# Patient Record
Sex: Male | Born: 1945 | Race: White | Hispanic: No | Marital: Married | Smoking: Former smoker
Health system: Southern US, Community
[De-identification: ages and names within clinical notes are randomized; demographics above are authoritative.]

## PROBLEM LIST (undated history)

## (undated) DIAGNOSIS — T7840XA Allergy, unspecified, initial encounter: Secondary | ICD-10-CM

## (undated) DIAGNOSIS — IMO0002 Reserved for concepts with insufficient information to code with codable children: Secondary | ICD-10-CM

## (undated) DIAGNOSIS — K449 Diaphragmatic hernia without obstruction or gangrene: Secondary | ICD-10-CM

## (undated) DIAGNOSIS — Z9889 Other specified postprocedural states: Secondary | ICD-10-CM

## (undated) DIAGNOSIS — M5412 Radiculopathy, cervical region: Secondary | ICD-10-CM

## (undated) DIAGNOSIS — M1990 Unspecified osteoarthritis, unspecified site: Secondary | ICD-10-CM

## (undated) DIAGNOSIS — I471 Supraventricular tachycardia, unspecified: Secondary | ICD-10-CM

## (undated) DIAGNOSIS — R569 Unspecified convulsions: Secondary | ICD-10-CM

## (undated) DIAGNOSIS — Z5189 Encounter for other specified aftercare: Secondary | ICD-10-CM

## (undated) DIAGNOSIS — I2510 Atherosclerotic heart disease of native coronary artery without angina pectoris: Secondary | ICD-10-CM

## (undated) DIAGNOSIS — M5416 Radiculopathy, lumbar region: Secondary | ICD-10-CM

## (undated) DIAGNOSIS — K219 Gastro-esophageal reflux disease without esophagitis: Secondary | ICD-10-CM

## (undated) DIAGNOSIS — F419 Anxiety disorder, unspecified: Secondary | ICD-10-CM

## (undated) DIAGNOSIS — R413 Other amnesia: Secondary | ICD-10-CM

## (undated) DIAGNOSIS — M542 Cervicalgia: Secondary | ICD-10-CM

## (undated) DIAGNOSIS — C801 Malignant (primary) neoplasm, unspecified: Secondary | ICD-10-CM

## (undated) DIAGNOSIS — K2270 Barrett's esophagus without dysplasia: Secondary | ICD-10-CM

## (undated) DIAGNOSIS — I77810 Thoracic aortic ectasia: Secondary | ICD-10-CM

## (undated) DIAGNOSIS — R55 Syncope and collapse: Secondary | ICD-10-CM

## (undated) DIAGNOSIS — R001 Bradycardia, unspecified: Secondary | ICD-10-CM

## (undated) DIAGNOSIS — R9430 Abnormal result of cardiovascular function study, unspecified: Secondary | ICD-10-CM

## (undated) DIAGNOSIS — J189 Pneumonia, unspecified organism: Secondary | ICD-10-CM

## (undated) DIAGNOSIS — IMO0001 Reserved for inherently not codable concepts without codable children: Secondary | ICD-10-CM

## (undated) DIAGNOSIS — M549 Dorsalgia, unspecified: Secondary | ICD-10-CM

## (undated) DIAGNOSIS — G8929 Other chronic pain: Secondary | ICD-10-CM

## (undated) DIAGNOSIS — F0390 Unspecified dementia without behavioral disturbance: Secondary | ICD-10-CM

## (undated) DIAGNOSIS — G4730 Sleep apnea, unspecified: Secondary | ICD-10-CM

## (undated) DIAGNOSIS — E7800 Pure hypercholesterolemia, unspecified: Secondary | ICD-10-CM

## (undated) HISTORY — DX: Abnormal result of cardiovascular function study, unspecified: R94.30

## (undated) HISTORY — DX: Syncope and collapse: R55

## (undated) HISTORY — DX: Allergy, unspecified, initial encounter: T78.40XA

## (undated) HISTORY — DX: Reserved for concepts with insufficient information to code with codable children: IMO0002

## (undated) HISTORY — PX: HAND SURGERY: SHX662

## (undated) HISTORY — DX: Atherosclerotic heart disease of native coronary artery without angina pectoris: I25.10

## (undated) HISTORY — PX: NECK SURGERY: SHX720

## (undated) HISTORY — PX: ANAL FISSURE REPAIR: SHX2312

## (undated) HISTORY — PX: OTHER SURGICAL HISTORY: SHX169

## (undated) HISTORY — DX: Unspecified convulsions: R56.9

## (undated) HISTORY — DX: Other amnesia: R41.3

## (undated) HISTORY — DX: Supraventricular tachycardia, unspecified: I47.10

## (undated) HISTORY — DX: Bradycardia, unspecified: R00.1

## (undated) HISTORY — DX: Other specified postprocedural states: Z98.890

## (undated) HISTORY — DX: Pure hypercholesterolemia, unspecified: E78.00

## (undated) HISTORY — DX: Supraventricular tachycardia: I47.1

## (undated) HISTORY — PX: VASECTOMY: SHX75

## (undated) HISTORY — DX: Barrett's esophagus without dysplasia: K22.70

## (undated) HISTORY — PX: TONSILLECTOMY: SUR1361

## (undated) HISTORY — DX: Thoracic aortic ectasia: I77.810

## (undated) HISTORY — PX: EXTERNAL EAR SURGERY: SHX627

---

## 2000-05-08 ENCOUNTER — Inpatient Hospital Stay (HOSPITAL_COMMUNITY): Admission: EM | Admit: 2000-05-08 | Discharge: 2000-05-10 | Payer: Self-pay | Admitting: Emergency Medicine

## 2000-05-08 ENCOUNTER — Encounter: Payer: Self-pay | Admitting: Cardiology

## 2001-05-07 ENCOUNTER — Ambulatory Visit (HOSPITAL_COMMUNITY): Admission: RE | Admit: 2001-05-07 | Discharge: 2001-05-07 | Payer: Self-pay | Admitting: General Surgery

## 2001-10-18 ENCOUNTER — Ambulatory Visit (HOSPITAL_COMMUNITY): Admission: RE | Admit: 2001-10-18 | Discharge: 2001-10-18 | Payer: Self-pay

## 2003-06-16 ENCOUNTER — Ambulatory Visit (HOSPITAL_COMMUNITY): Admission: RE | Admit: 2003-06-16 | Discharge: 2003-06-16 | Payer: Self-pay | Admitting: General Surgery

## 2003-06-24 ENCOUNTER — Ambulatory Visit (HOSPITAL_COMMUNITY): Admission: RE | Admit: 2003-06-24 | Discharge: 2003-06-24 | Payer: Self-pay | Admitting: Family Medicine

## 2006-02-02 ENCOUNTER — Ambulatory Visit: Payer: Self-pay | Admitting: Cardiology

## 2006-02-02 ENCOUNTER — Inpatient Hospital Stay (HOSPITAL_COMMUNITY): Admission: AD | Admit: 2006-02-02 | Discharge: 2006-02-04 | Payer: Self-pay | Admitting: Cardiology

## 2006-03-14 ENCOUNTER — Ambulatory Visit: Payer: Self-pay | Admitting: Gastroenterology

## 2006-03-29 ENCOUNTER — Encounter (INDEPENDENT_AMBULATORY_CARE_PROVIDER_SITE_OTHER): Payer: Self-pay | Admitting: Specialist

## 2006-03-29 ENCOUNTER — Ambulatory Visit (HOSPITAL_COMMUNITY): Admission: RE | Admit: 2006-03-29 | Discharge: 2006-03-29 | Payer: Self-pay | Admitting: Gastroenterology

## 2006-03-29 ENCOUNTER — Ambulatory Visit: Payer: Self-pay | Admitting: Gastroenterology

## 2006-03-29 DIAGNOSIS — Z9889 Other specified postprocedural states: Secondary | ICD-10-CM

## 2006-03-29 HISTORY — DX: Other specified postprocedural states: Z98.890

## 2006-04-11 ENCOUNTER — Ambulatory Visit: Payer: Self-pay | Admitting: Gastroenterology

## 2006-09-29 ENCOUNTER — Other Ambulatory Visit: Admission: RE | Admit: 2006-09-29 | Discharge: 2006-09-29 | Payer: Self-pay | Admitting: Orthopaedic Surgery

## 2006-09-29 ENCOUNTER — Encounter (INDEPENDENT_AMBULATORY_CARE_PROVIDER_SITE_OTHER): Payer: Self-pay | Admitting: *Deleted

## 2007-04-24 ENCOUNTER — Ambulatory Visit (HOSPITAL_COMMUNITY): Admission: RE | Admit: 2007-04-24 | Discharge: 2007-04-24 | Payer: Self-pay | Admitting: Family Medicine

## 2007-05-02 ENCOUNTER — Emergency Department (HOSPITAL_COMMUNITY): Admission: EM | Admit: 2007-05-02 | Discharge: 2007-05-02 | Payer: Self-pay | Admitting: Emergency Medicine

## 2007-07-17 ENCOUNTER — Inpatient Hospital Stay (HOSPITAL_COMMUNITY): Admission: RE | Admit: 2007-07-17 | Discharge: 2007-07-19 | Payer: Self-pay | Admitting: Neurosurgery

## 2008-01-08 ENCOUNTER — Encounter: Admission: RE | Admit: 2008-01-08 | Discharge: 2008-01-08 | Payer: Self-pay | Admitting: Neurosurgery

## 2008-11-04 ENCOUNTER — Encounter: Admission: RE | Admit: 2008-11-04 | Discharge: 2008-11-04 | Payer: Self-pay | Admitting: Neurology

## 2009-05-19 DIAGNOSIS — E119 Type 2 diabetes mellitus without complications: Secondary | ICD-10-CM

## 2009-05-19 DIAGNOSIS — E785 Hyperlipidemia, unspecified: Secondary | ICD-10-CM | POA: Insufficient documentation

## 2011-01-11 NOTE — Op Note (Signed)
NAME:  Joshua Khan, Joshua Khan NO.:  [id number]   MEDICAL RECORD NO.:  [id number]          PATIENT TYPE:  INP   LOCATION:  5023                         FACILITY:  [facility]   PHYSICIAN:  Hilda Lias, M.D.   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   PROCEDURE:  C5-6, C6-7 diskectomy, decompression of the spinal cord,  bilateral foraminotomy, interbody fusion with allograft plate from C5 to  C7, microscope.   SURGEON:  Hilda Lias, M.D.   ASSISTANT:  Danae Orleans. Venetia Maxon, M.D.   CLINICAL HISTORY:  The patient was admitted because of neck pain with  radiation into both upper extremities.  X-rays showed severe stenosis at  the level C5-6, C6-7. The patient has failed conservative treatment.  Surgery was advised.   DESCRIPTION OF PROCEDURE:  The patient was taken to the OR and after  intubation, the left side of the neck was cleaned with DuraPrep. A  transverse incision was made through the skin and platysma down to the  cervical spine.  The patient has a big shoulder and we put a needle at  the level of C4-5 because we were unable to localize C5-6, C6-7. Indeed  the x-rays showed that we were at the level of C4-5. Then with the  anterior osteophyte at the level of C5-6 and C6-7 until we found the  calcified anterior ligament. With the microscope, the incision was made  transverse in the ligament.  __________ space between the vertebral  body.  With the drill and the Kerrison punch 1-2 mm, we were able to get  into the posterior ligament. The posterior ligament was opened and  decompression of the spinal cord as well as the foramen was achieved.  The same procedure with the same finding was at the level of C6-7.  From  then on,  the endplates were drilled and two pieces of allograft of 7 mm  were inserted followed by a plate using 6 screws.  The lateral cervical  spine showed at least the upper part of the plate was normal.  From then  on, we waited 5 minutes just to be sure that we have good hemostasis.  Nevertheless, a drain was left and the wound was closed with Vicryl and  Steri-Strips.           ______________________________  Hilda Lias, M.D.     EB/MEDQ  D:  07/17/2007  T:  07/18/2007  Job:  [id number]

## 2011-01-14 NOTE — Cardiovascular Report (Signed)
[place]  Patient:    Joshua Khan, Joshua Khan                          MRN: [id number] Proc. Date: 05/09/00 Adm. Date:  [id number] Attending:  Ophelia Shoulder CC:         Dr. _____________  Lenise Herald, M.D.  Lennette Bihari, M.D., Attn. Morton Stall (Office)   Cardiac Catheterization  NAME OF PROCEDURE:            Cardiac catheterization.  CARDIOLOGIST:                 Lennette Bihari, M.D., F.A.C.C.  INDICATIONS:                  Joshua Khan is a 65 year old gentleman who was admitted yesterday with new onset chest discomfort suggesting possible unstable angina.  He is now referred for cardiac catheterization.  HEMODYNAMIC DATA:             Central aortic pressure is 124/77.  Left ventricular pressure is 124/21.  ANGIOGRAPHIC DATA: 1.  Left main coronary artery is a short and angiographically normal vessel that bifurcates into the an LAD and left circumflex system. 2.  The LAD is a moderate-sized vessel that extends to the apex.  The mid LAD seemed to dip intramyocardially and there was evidence for mild muscle bridging in the distal aspect of this intramyocardial segment up to 30% to less than 40%.  The vessel was smooth without apparent atherosclerotic obstruction. 3.  The left circumflex coronary artery gave rise to a high marginal vessel that almost had a ramus intermedius distribution.   The circumflex and marginal vessel was angiographically normal. 4.  The right coronary artery gave rise to a PDA, a small inferior LV branch and ended in the posterolateral coronary artery.  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 5.  Biplane scintiventriculography showed normal global LV function.  The RAO projection suggested subtle area of mid anterolateral hypocontractility. The LAO projection showed fairly normal  contractility. 6.  Distal aortography did not demonstrate any significant idioiliac disease.  IMPRESSION: 1. Preserved global left ventricular function with a subtle midanterolateral    hypocontractility. 2. No significant coronary obstructive disease, but evidence for    intramyocardial mid left anterior descending segment with mild muscle    bridging with narrowing up to approximately 30% to less than 40%. 3. Normal left circumflex coronary system. 4. 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  DISCUSSION:                   Mr. Advani presented with chest pain syndrome possibly suggesting unstable angina.  Ventriculography does raise the possibility of subtle midanterolateral hypocontractility.  With the demonstration of a mid LAD segment that dips intramyocardially with mild muscle bridging this does raise the possibility of some superimposed spasm in the etiology to this mild wall motion abnormality.  The patient will be started on calcium channel blocker therapy. DD:  05/09/00 TD:  05/10/00 Job: [id number]

## 2011-01-14 NOTE — Cardiovascular Report (Signed)
NAME:  Joshua Khan, Joshua Khan NO.:  [id number]   MEDICAL RECORD NO.:  [id number]          PATIENT TYPE:  INP   LOCATION:  3735                         FACILITY:  [facility]   PHYSICIAN:  Rollene Rotunda, M.D.   DATE OF BIRTH:  1946-02-26   DATE OF PROCEDURE:  02/03/2006  DATE OF DISCHARGE:                              CARDIAC CATHETERIZATION   PRIMARY CARE PHYSICIAN:  Patrica Duel, M.D.   CARDIOLOGIST:  Willa Rough, M.D.   PROCEDURE:  Left heart catheterization/coronary arteriography.   INDICATION:  Evaluate the patient with chest pain and a slightly elevated  troponin. He had previous nonobstructive coronary artery disease on cardiac  catheterization.   PROCEDURE NOTE:  Left heart catheterization is performed via the right  femoral artery. The artery was cannulated using the anterior wall puncture.  A #6-French arterial sheath was inserted via the modified Seldinger  technique. A preformed Judkins and a pigtail catheter were utilized. The  patient tolerated the procedure well and left the laboratory in stable  condition.   RESULTS:   HEMODYNAMICS:  LV 128/21, AO 130/94. Coronaries--the left main was normal.  The LAD wrapped the apex. There was diffuse luminal irregularities. The  first diagonal was large and normal. The second and third diagonals were  small and normal. The circumflex and the AV groove had luminal  irregularities. There was a large ramus intermedius, which was normal. The  right coronary artery was dominant. It had diffuse 25% plaque. There was  somewhat slow flow through the vessel. There was 30% stenosis after the PDA.  There were no high-grade obstructive lesions. The PDA and posterolateral  times two were moderate to small with luminal irregularities.   LEFT VENTRICULOGRAM:  The left ventriculogram was obtained in the RAO  projection. The EF was 60% with normal wall motion.   CONCLUSION:  Nonobstructive plaque. Well-preserved ejection  fraction.  However, there is diffuse cholesterol plaque particularly in the right  system.   PLAN:  The patient will have medical management. It is not clear that his  chest pain is angina. However, it could be related to small vessel disease,  and therefore I will start Imdur.  Refer him back to Dr. Nobie Putnam  (Dr.  Nobie Putnam is  not in the office to discuss this today). Will consider combination therapy  with either Niaspan, Tricor, and a Statin for his dyslipidemia and his  nonobstructive disease. This was discussed with the patient. The patient  will also be referred back to see Dr. Myrtis Ser in [place].           ______________________________  Rollene Rotunda, M.D.     JH/MEDQ  D:  02/03/2006  T:  02/03/2006  Job:  [id number]   cc:   Patrica Duel, M.D.  Fax: [phone]
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
## 2011-01-14 NOTE — Discharge Summary (Signed)
NAME:  GEORGIE, HAQUE NO.:  [id number]   MEDICAL RECORD NO.:  [id number]          PATIENT TYPE:  INP   LOCATION:  3735                         FACILITY:  [facility]   PHYSICIAN:  Willa Rough, M.D.     DATE OF BIRTH:  November 13, 1945   DATE OF ADMISSION:  02/02/2006  DATE OF DISCHARGE:  02/04/2006                                 DISCHARGE SUMMARY   PRIMARY CARDIOLOGIST:  Willa Rough, M.D.   PRIMARY CARE PHYSICIAN:  Patrica Duel, M.D.   REASON FOR ADMISSION:  Transfer from [facility] secondary to chest  pain and equivocal enzymes.   DISCHARGE DIAGNOSES:  1.  Chest pain, question related to small vessel disease.  2.  Non-obstructive coronary disease by catheterization this admission.  3.  Good left ventricular function.  4.  Diet controlled diabetes mellitus.  5.  Asymptomatic sinus bradycardia.  6.  Treated dyslipidemia.  7.  Chronic low back pain.  8.  History of cervical disk disease.  9.  Gastroesophageal reflux disease.      1.  History of esophageal dilatation.  10. The patient has a history of right rib surgery done in the past.   PROCEDURE:  1.  Cardiac catheterization by Dr. Rollene Rotunda. Please see his dictated      note for complete details. Briefly, left main was normal. LAD with      diffuse luminal irregularities. Circumflex with luminal irregularities.      Dominant RCA with diffuse 25% plaque, slow flow 30% after PDA. No high      grade obstructive lesion. EF of 60% with normal wall motion.   HISTORY OF PRESENT ILLNESS:  Please see the consultation note from Dr. Myrtis Ser  for complete details. Briefly, this 65 year old male was admitted at  [facility] with chest discomfort, with radiation to his jaw. His  troponin's were slightly elevated at 0.1. He continued to have pain and  therefore, it was decided to transfer him to [facility] for further  evaluation and cardiac catheterization.   HOSPITAL COURSE:  The patient went for  cardiac catheterization on February 03, 2006. The results are noted above. Dr. Antoine Poche added Imdur 30 mg a day for  possible chest pain related to small vessel disease. Post catheterization,  he had some visual changes. A head CT was negative per report. The patient  was seen by Dr. Myrtis Ser on the morning of February 04, 2006. He felt the patient was  stable enough for discharge to home. In talking to the patient, it was  decided that the patient would followup with Dr. Nobie Putnam. The patient and  Dr. Nobie Putnam would then decide regarding any cardiology followup in the  future.   LABORATORY/DIAGNOSTIC DATA:  Chest x-ray from February 02, 2006 - Tortuous aorta,  top normal heart size, and central pulmonary vasculature. For delineation of  the anterior aspect of the right first rib. A destructive lesion could not  be excluded. This will require followup by apical pleural thickening.  Followup as noted.   White count 6,500. Hemoglobin and hematocrit 13.9 and 40.6.  Platelet count  218,000. INR 1.1, sodium 137, potassium 4.0. Glucose 87, BUN 10, creatinine  0.8. CK MB is negative x2. Troponin I 0.09 and 0.08. Total cholesterol 165,  triglycerides 151, HDL 23, LDL 112.   DISCHARGE MEDICATIONS:  1.  Imdur 30 mg daily.  2.  Coated aspirin 81 mg daily.  3.  Nexium 40 mg daily.  4.  Zetia 10 mg daily.  5.  Nitroglycerin p.r.n. chest pain.   ACTIVITY:  No driving or heavy lifting or sexual activity for 3 days. He may  shower and bathe. He may increase his activity slowly. He is provided with a  supplemental form to go over his discharge instructions.   DIET:  Low-fat, low-sodium, diabetic diet.   WOUND CARE:  He should call our office for any groin bleeding or bruising.   FOLLOWUP:  As noted above, will be with Dr. Nobie Putnam in 1 to 2 weeks and he  should call for an appointment. If the patient and Dr. Nobie Putnam decide for  cardiology followup in the future, then the patient may be seen either with  Dr. Myrtis Ser  in our [place] office or established as a new patient with one of our  doctor's in our [place] office.   DISPOSITION:  The patient has low HDL and high LDL. Consideration could be  made for combination therapy. This will be per his primary care physician.  The patient's chest x-ray is abnormal but he has a history of right rib  surgery. Followup will be per his primary care physician, Dr. Nobie Putnam.   DURATION OF DISCHARGE ENCOUNTER:  Greater than 30 minutes.      Tereso Newcomer, P.A.    ______________________________  Willa Rough, M.D.    SW/MEDQ  D:  02/04/2006  T:  02/04/2006  Job:  [id number]   cc:   Patrica Duel, M.D.  Fax: [phone]   Willa Rough, M.D.  [address]  [address]  [place]  [place] [address]   [facility]  [address]  [address]  [place], [place]. [address]   [facility]  [facility]  [place], [place].

## 2011-01-14 NOTE — Op Note (Signed)
NAME:  Joshua Khan, Joshua Khan NO.:  [id number]   MEDICAL RECORD NO.:  [id number]          PATIENT TYPE:  AMB   LOCATION:  DAY                           FACILITY:  [facility]   PHYSICIAN:  Kassie Mends, M.D.      DATE OF BIRTH:  [birth year]   DATE OF PROCEDURE:  03/30/2006  DATE OF DISCHARGE:                                 OPERATIVE REPORT   PROCEDURE:  Esophagogastroduodenoscopy with cold-forceps biopsy.   MEDICATIONS:  1. Demerol 75 mg IV.  2. Versed 6 mg IV.   INDICATIONS FOR EXAMINATION:  Joshua Khan is a 65 year old male with chest  pressure and upper abdominal pressure.   FINDINGS:  1. Salmon-colored tongue extending from the EG junction.  Biopsies      obtained to assess for short segment Barrett's.  Otherwise esophagus      had no evidence of inflammation or mass.  2. Gastric nodule seen in the antrum.  Biopsies obtained via cold forceps.  3. Normal pylorus and duodenum.   RECOMMENDATIONS:  1. Follow up biopsies.  2. Joshua Khan has received symptomatic improvement on PPI.  The most likely      cause for his symptoms is reflux esophagitis but will await biopsies      for final conclusion.  3. Follow with Dr. Nobie Putnam.  He has a follow-up appointment to see me.   PROCEDURE TECHNIQUE:  Physical exam was performed, and informed consent was  obtained from the patient after explaining all risks, benefits and  alternatives to the procedure which the patient appeared to understand and  so stated.  The patient was connected to monitor device and placed in the  left lateral position.  Continuous oxygen was provided by nasal cannula and  IV medicine administered through an indwelling cannula.  After  administration of sedation, the patient's esophagus was intubated, and the  scope was advanced under direct visualization to the second  portion of the duodenum.  The scope was subsequently removed slowly by  carefully examining the color, texture, anatomy and integrity of the  mucosa  on the way out.  The patient was recovered in endoscopy suite and discharged  to home in satisfactory condition.      Kassie Mends, M.D.  Electronically Signed     SM/MEDQ  D:  03/30/2006  T:  03/30/2006  Job:  [id number]   cc:   Patrica Duel, M.D.  Fax: [phone]

## 2011-01-14 NOTE — Op Note (Signed)
   NAME:  Joshua Khan, Joshua Khan NO.:  [id number]   MEDICAL RECORD NO.:  [id number]                   PATIENT TYPE:  AMB   LOCATION:  DAY                                  FACILITY:  [facility]   PHYSICIAN:  Dalia Heading, M.D.               DATE OF BIRTH:  04-24-1946   DATE OF PROCEDURE:  06/16/2003  DATE OF DISCHARGE:                                 OPERATIVE REPORT   PREOPERATIVE DIAGNOSS:  Intractable anal fissure.   POSTOPERATIVE DIAGNOSS:  Intractable anal fissure.   PROCEDURE:  Anal fissurectomy.   SURGEON:  Dalia Heading, M.D.   ANESTHESIA:  General endotracheal.   INDICATIONS:  The patient is a 65 year old white male who presents with an  intractable anal fissure.  The risks and benefits of the procedure including  bleeding, infection, and recurrence of the fissure were fully explained to  the patient, who gave informed consent   DESCRIPTION OF PROCEDURE:  The patient was placed in the lithotomy position  after induction of general endotracheal anesthesia.  The perineum was  prepped and draped using the usual sterile technique with Betadine.  Surgical site confirmation was performed.   On anoscopy, the patient had minimal hemorrhoidal disease.  No internal  sphincter stricture or spasm were noted.  A small anterior anal fissure tear  was noted.  This was excised and the mucosa reapproximated using a 2-0  Vicryl suture.  Sensorcaine 0.5% was instilled into the surrounding wound.  No abnormal bleeding was noted at the end of the procedure.  Surgicel and  viscous Xylocaine packing were then placed into the rectum.   All tape and needle counts were correct at the end of the procedure.  The  patient was extubated in the operating room and went back to recovery room  awake, in stable condition   COMPLICATIONS:  None.    SPECIMENS:  None.   BLOOD LOSS:  Minimal.      ___________________________________________             Dalia Heading, M.D.   MAJ/MEDQ  D:  06/16/2003  T:  06/16/2003  Job:  [id number]   cc:   Patrica Duel, M.D.  [address]  [place]  [place] [address]  Fax: [phone]

## 2011-01-14 NOTE — H&P (Signed)
   NAME:  Joshua Khan, Bartles NO.:  [id number]   MEDICAL RECORD NO.:  [id number]                  PATIENT TYPE:   LOCATION:                                       FACILITY:   PHYSICIAN:  Dalia Heading, M.D.               DATE OF BIRTH:  October 09, 1945   DATE OF ADMISSION:  DATE OF DISCHARGE:                                HISTORY & PHYSICAL   CHIEF COMPLAINT:  Anal fissure.   HISTORY OF PRESENT ILLNESS:  The patient is a 65 year old white male who  presents with rectal pain.  He has had an anterior anal fissure which has  not resolved despite medical therapy.   PAST MEDICAL HISTORY:  High cholesterol levels, coronary artery disease.   PAST SURGICAL HISTORY:  Heart catheterization in 2002, shoulder surgery, ear  surgery, colonoscopy in 2002.   CURRENT MEDICATIONS:  Lipitor, a chewable aspirin which he is holding,  Nexium.   ALLERGIES:  CODEINE and HIGH-DOSE ASPIRIN.   REVIEW OF SYSTEMS:  The patient denies drinking or smoking.  He denies any  other cardiopulmonary difficulties or bleeding disorders.   PHYSICAL EXAMINATION:  GENERAL:  The patient is a well-developed, well-  nourished white male in no acute distress.  VITAL SIGNS:  He is afebrile and vital signs are stable.  LUNGS:  Clear to auscultation with equal breath sounds bilaterally.  HEART:  Reveals a regular rate and rhythm without S3, S4, or murmurs.  ABDOMEN:  Soft, nontender, nondistended.  No hepatosplenomegaly or masses  are noted.  RECTAL:  Deferred to the procedure.   IMPRESSION:  Intractable anal fissure.    PLAN:  The patient is scheduled for an anal fissurectomy with possible  hemorrhoidectomy on June 11, 2003.  The risks and benefits of the  procedure including bleeding, infection, and recurrence of the fissure were  fully explained to the patient, who gave informed consent.     ___________________________________________                                         Dalia Heading, M.D.   MAJ/MEDQ  D:  06/05/2003  T:  06/05/2003  Job:  [id number]   cc:   Patrica Duel, M.D.  [address]  [place]  [place] [address]  Fax: [phone]

## 2011-06-07 LAB — BASIC METABOLIC PANEL
Calcium: 9.5
Creatinine, Ser: 0.8
GFR calc Af Amer: >60

## 2011-06-07 LAB — CBC
RBC: 4.72
WBC: 8

## 2011-06-21 ENCOUNTER — Encounter: Payer: Self-pay | Admitting: Gastroenterology

## 2011-06-21 ENCOUNTER — Ambulatory Visit (INDEPENDENT_AMBULATORY_CARE_PROVIDER_SITE_OTHER): Payer: Medicare Other | Admitting: Gastroenterology

## 2011-06-21 VITALS — BP 108/62 | HR 57 | Temp 97.9°F | Ht 74.0 in | Wt 250.8 lb

## 2011-06-21 DIAGNOSIS — R195 Other fecal abnormalities: Secondary | ICD-10-CM

## 2011-06-21 DIAGNOSIS — K2270 Barrett's esophagus without dysplasia: Secondary | ICD-10-CM

## 2011-06-21 NOTE — Assessment & Plan Note (Signed)
65 year old male with hx of short-segment Barrett's esophagus on EGD in 2007 as well as +H.pylori on path. However, he is unsure if he completed treatment for this or not. He complains of dysphagia and belching for the past few months. On Prilosec daily. Only one episode of epigastric pain, resolved on own. No N/V. Will need EGD for Barrett's surveillance, biopsy for H.pylori at time of EGD. Will need treatment if positive. Pt states he is overdue for surveillance as his dog passed away a few years ago and missed an appt; apparently lost to follow-up.  Proceed with upper endoscopy in the near future with Dr. Darrick Penna. The risks, benefits, and alternatives have been discussed in detail with patient. They have stated understanding and desire to proceed.  Continue Prilosec

## 2011-06-21 NOTE — Assessment & Plan Note (Signed)
Change in frequency of stools over past month or so. Only medication change is cod liver oil, but he states loose stools were occuring prior to starting this medication. Does not want to stop this currently. No recent abx. Postprandial component. Not eating high fiber diet. Doubt infectious etiology. Will assess with stool studies, add high fiber diet. Retrieve last colonoscopy reports. As pt states this was greater than 10 years ago, will add TCS at time of EGD.  Cdiff PCR, Giardia, lactoferrin, Stool culture High fiber diet handout Benefiber if necessary Proceed with colonoscopy with Dr. Darrick Penna in the near future. The risks, benefits, and alternatives have been discussed in detail with the patient. They state understanding and desire to proceed.  Retrieve colonoscopy reports

## 2011-06-21 NOTE — Patient Instructions (Signed)
Start eating a high fiber diet. You may take Benefiber 1 tbsp daily for added fiber.   Please complete stool studies and return to the lab.  Continue Prilosec daily.  We have set you up for a look at your upper and lower GI tract in the near future. Further recommendations to follow.

## 2011-06-21 NOTE — Progress Notes (Signed)
Primary Care Physician:  Ignatius Specking., MD, MD Primary Gastroenterologist:  Dr. Darrick Penna   Chief Complaint  Patient presents with  . Dysphagia    HPI:   [name] is a pleasant 65 year old Caucasian male with a hx of short-segment Barrett's esophagus, diagnosed on EGD in 2007 with Dr. Darrick Penna. At that time, biopsies were + for H.pylori; however, pt is unsure if he received treatment or not. Presents today with dysphagia and belching for approximately 3-4 months. No odynophagia. One episode of epigastric pain yesterday morning, felt like a muscle tightening, resolved on own. No further or prior episodes. Denies use of NSAIDs or aspirin powders. Takes Prilosec daily. Also concerned about changes in bowel habits. Reports about a month-long hx of uncontrollable BMs every time he urinated. This has lessened in the past several weeks; he now has a loose/soft bowel movement 4-5 times per day, usually postprandially. Denies melena or hematochezia. No n/v. Before this increase in frequency, he would have a BM 3 times per day. Does report starting cod liver oil about 30 days ago. Has short-term memory issues, on Aricept. Trying to get GED for about 6 years. 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 Denies recent abx. Unsure of timing of last colonoscopy but believes it was >10 years ago.    Past Medical History  Diagnosis Date  . Hypercholesterolemia   . Diabetes mellitus     diet-controlled  . 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 Barrett's esophagus   . S/P endoscopy August 2007    Dr. Darrick Penna: chronic active gastritis, +H.pylori, ? treatment?, short-segment Barrett's  . S/P colonoscopy     ? awaiting records?    Past Surgical History  Procedure Date  . Neck surgery   . Tonsillectomy   . External ear surgery   . Hand surgery   . Vasectomy   . Anal fissure repair   . Skin cancer removal     Current Outpatient Prescriptions  Medication Sig Dispense Refill  . aspirin 81 MG  tablet Take 81 mg by mouth daily.        . calcium carbonate (OS-CAL) 600 MG TABS Take 600 mg by mouth 2 (two) times daily with a meal.        . Cholecalciferol (VITAMIN D3) 1000 UNITS CAPS Take by mouth.        . COD LIVER OIL PO Take by mouth.        . donepezil (ARICEPT) 10 MG tablet Take 10 mg by mouth at bedtime.       . Garlic 1000 MG CAPS Take 1,000 mg by mouth daily.        . Methylsulfonylmethane (MSM) 1500 MG TABS Take 1,500 mg by mouth daily.        . Multiple Vitamin (MULTIVITAMIN) capsule Take 1 capsule by mouth daily.        Marland Kitchen omeprazole (PRILOSEC) 20 MG capsule Take 20 mg by mouth daily.        . simvastatin (ZOCOR) 40 MG tablet Take 40 mg by mouth at bedtime.       Marland Kitchen UNKNOWN TO PATIENT Grape seed complex 1 tablet daily        . zinc gluconate 50 MG tablet Take 50 mg by mouth daily.          Allergies as of 06/21/2011 - Review Complete 06/21/2011  Allergen Reaction Noted  . Aspirin    . Codeine  Family History  Problem Relation Age of Onset  . Colon cancer Neg Hx     History   Social History  . Marital Status: Married    Spouse Name: N/A    Number of Children: N/A  . Years of Education: N/A   Occupational History  . Not on file.   Social History Main Topics  . Smoking status: Former Games developer  . Smokeless tobacco: Not on file   Comment: smoked age [age]  . Alcohol Use: No  . Drug Use: No  . Sexually Active: Not on file   Other Topics Concern  . Not on file   Social History Narrative  . No narrative on file    Review of Systems: Gen: Denies any fever, chills, fatigue, weight loss, lack of appetite.  CV: Denies chest pain, heart palpitations, peripheral edema, syncope.  Resp: Denies shortness of breath at rest or with exertion. Denies wheezing or cough.  GI: SEE HPI GU : Denies urinary burning, urinary frequency, urinary hesitancy MS: Denies joint pain, muscle weakness, cramps, or limitation of movement.  Derm: Denies rash, itching, dry  skin Psych: Denies depression, anxiety, memory loss, and confusion Heme: Denies bruising, bleeding, and enlarged lymph nodes.  Physical Exam: BP 108/62  Pulse 57  Temp(Src) 97.9 F (36.6 C) (Temporal)  Ht 6\' 2"  (1.88 m)  Wt 250 lb 12.8 oz (113.762 kg)  BMI 32.20 kg/m2 General:   Alert and oriented. Pleasant and cooperative. Well-nourished and well-developed.  Head:  Normocephalic and atraumatic. Eyes:  Without icterus, sclera clear and conjunctiva pink.  Ears:  Normal auditory acuity. Nose:  No deformity, discharge,  or lesions. Mouth:  No deformity or lesions, oral mucosa pink.  Neck:  Supple, without mass or thyromegaly. Lungs:  Clear to auscultation bilaterally. No wheezes, rales, or rhonchi. No distress.  Heart:  S1, S2 present without murmurs appreciated.  Abdomen:  +BS, soft, non-tender and non-distended. No HSM noted. No guarding or rebound. No masses appreciated.  Rectal:  Deferred  Msk:  Symmetrical without gross deformities. Normal posture. Extremities:  Without clubbing or edema. Neurologic:  Alert and  oriented x4;  grossly normal neurologically. Skin:  Intact without significant lesions or rashes. Cervical Nodes:  No significant cervical adenopathy. Psych:  Alert and cooperative. Normal mood and affect.

## 2011-06-21 NOTE — Progress Notes (Signed)
Cc to PCP 

## 2011-06-24 LAB — CLOSTRIDIUM DIFFICILE BY PCR: Toxigenic C. Difficile by PCR: NOT DETECTED

## 2011-06-24 LAB — FECAL LACTOFERRIN, QUANT: Lactoferrin: NEGATIVE

## 2011-06-24 LAB — GIARDIA ANTIGEN: Giardia Screen (EIA): NEGATIVE

## 2011-06-27 LAB — STOOL CULTURE

## 2011-06-28 MED ORDER — SODIUM CHLORIDE 0.45 % IV SOLN: Freq: Once | INTRAVENOUS | Status: DC

## 2011-06-29 ENCOUNTER — Ambulatory Visit (HOSPITAL_COMMUNITY)
Admission: RE | Admit: 2011-06-29 | Discharge: 2011-06-29 | Disposition: A | Discharge status: Home / Self Care | Payer: Medicare Other | Source: Ambulatory Visit | Attending: Gastroenterology | Admitting: Gastroenterology

## 2011-06-29 ENCOUNTER — Encounter (HOSPITAL_COMMUNITY): Payer: Self-pay | Admitting: *Deleted

## 2011-06-29 ENCOUNTER — Other Ambulatory Visit: Payer: Self-pay | Admitting: Gastroenterology

## 2011-06-29 ENCOUNTER — Encounter (HOSPITAL_COMMUNITY)
Admission: RE | Disposition: A | Discharge status: Home / Self Care | Payer: Self-pay | Source: Ambulatory Visit | Attending: Gastroenterology

## 2011-06-29 DIAGNOSIS — K2270 Barrett's esophagus without dysplasia: Secondary | ICD-10-CM | POA: Insufficient documentation

## 2011-06-29 DIAGNOSIS — E119 Type 2 diabetes mellitus without complications: Secondary | ICD-10-CM | POA: Insufficient documentation

## 2011-06-29 DIAGNOSIS — K648 Other hemorrhoids: Secondary | ICD-10-CM

## 2011-06-29 DIAGNOSIS — K5730 Diverticulosis of large intestine without perforation or abscess without bleeding: Secondary | ICD-10-CM | POA: Insufficient documentation

## 2011-06-29 DIAGNOSIS — K2970 Gastritis, unspecified, without bleeding: Secondary | ICD-10-CM

## 2011-06-29 DIAGNOSIS — K2990 Gastroduodenitis, unspecified, without bleeding: Secondary | ICD-10-CM

## 2011-06-29 DIAGNOSIS — K2940 Chronic atrophic gastritis without bleeding: Secondary | ICD-10-CM | POA: Insufficient documentation

## 2011-06-29 DIAGNOSIS — R198 Other specified symptoms and signs involving the digestive system and abdomen: Secondary | ICD-10-CM

## 2011-06-29 DIAGNOSIS — Z7982 Long term (current) use of aspirin: Secondary | ICD-10-CM | POA: Insufficient documentation

## 2011-06-29 DIAGNOSIS — R1310 Dysphagia, unspecified: Secondary | ICD-10-CM | POA: Insufficient documentation

## 2011-06-29 DIAGNOSIS — R195 Other fecal abnormalities: Secondary | ICD-10-CM

## 2011-06-29 DIAGNOSIS — R197 Diarrhea, unspecified: Secondary | ICD-10-CM | POA: Insufficient documentation

## 2011-06-29 DIAGNOSIS — E7800 Pure hypercholesterolemia, unspecified: Secondary | ICD-10-CM | POA: Insufficient documentation

## 2011-06-29 HISTORY — PX: ESOPHAGOGASTRODUODENOSCOPY: SHX1529

## 2011-06-29 HISTORY — DX: Diaphragmatic hernia without obstruction or gangrene: K44.9

## 2011-06-29 HISTORY — DX: Anxiety disorder, unspecified: F41.9

## 2011-06-29 HISTORY — DX: Malignant (primary) neoplasm, unspecified: C80.1

## 2011-06-29 HISTORY — DX: Gastro-esophageal reflux disease without esophagitis: K21.9

## 2011-06-29 HISTORY — DX: Encounter for other specified aftercare: Z51.89

## 2011-06-29 HISTORY — PX: COLONOSCOPY: SHX174

## 2011-06-29 HISTORY — DX: Pneumonia, unspecified organism: J18.9

## 2011-06-29 HISTORY — DX: Sleep apnea, unspecified: G47.30

## 2011-06-29 HISTORY — DX: Unspecified osteoarthritis, unspecified site: M19.90

## 2011-06-29 HISTORY — DX: Reserved for inherently not codable concepts without codable children: IMO0001

## 2011-06-29 SURGERY — COLONOSCOPY WITH ESOPHAGOGASTRODUODENOSCOPY (EGD)
Anesthesia: Moderate Sedation

## 2011-06-29 MED ORDER — MIDAZOLAM HCL 5 MG/5ML IJ SOLN
INTRAMUSCULAR | Status: DC | PRN
  Administered 2011-06-29: 1 mg via INTRAVENOUS
  Administered 2011-06-29: 2 mg via INTRAVENOUS

## 2011-06-29 MED ORDER — BUTAMBEN-TETRACAINE-BENZOCAINE 2-2-14 % EX AERO
INHALATION_SPRAY | CUTANEOUS | Status: DC | PRN
  Administered 2011-06-29: 1 via TOPICAL

## 2011-06-29 MED ORDER — MEPERIDINE HCL 100 MG/ML IJ SOLN
INTRAMUSCULAR | Status: AC
  Filled 2011-06-29: qty 1

## 2011-06-29 MED ORDER — MIDAZOLAM HCL 5 MG/5ML IJ SOLN
INTRAMUSCULAR | Status: AC
  Filled 2011-06-29: qty 10

## 2011-06-29 MED ORDER — MEPERIDINE HCL 100 MG/ML IJ SOLN
INTRAMUSCULAR | Status: DC | PRN
  Administered 2011-06-29: 50 mg via INTRAVENOUS

## 2011-06-29 NOTE — H&P (Signed)
Reason for Visit     Dysphagia        Vitals - Last Recorded       BP Pulse Temp(Src) Ht Wt BMI    108/62  57  97.9 F (36.6 C) (Temporal)  6\' 2"  (1.88 m)  250 lb 12.8 oz (113.762 kg)  32.20 kg/m2       Progress Notes     Joshua Halls, Joshua Khan  06/21/2011  3:13 PM  Signed   Primary Care Physician:  Ignatius Specking., MD, MD Primary Gastroenterologist:  Dr. Darrick Penna     Chief Complaint   Patient presents with   .  Dysphagia      HPI:    Joshua Khan is a pleasant 65 year old Caucasian male with a hx of short-segment Barrett's esophagus, diagnosed on EGD in 2007 with Dr. Darrick Penna. At that time, biopsies were + for H.pylori; however, pt is unsure if he received treatment or not. Presents today with dysphagia and belching for approximately 3-4 months. No odynophagia. One episode of epigastric pain yesterday morning, felt like a muscle tightening, resolved on own. No further or prior episodes. Denies use of NSAIDs or aspirin powders. Takes Prilosec daily. Also concerned about changes in bowel habits. Reports about a month-long hx of uncontrollable BMs every time he urinated. This has lessened in the past several weeks; he now has a loose/soft bowel movement 4-5 times per day, usually postprandially. Denies melena or hematochezia. No n/v. Before this increase in frequency, he would have a BM 3 times per day. Does report starting cod liver oil about 30 days ago. Has short-term memory issues, on Aricept. Trying to get GED for about 6 years. 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 Denies recent abx. Unsure of timing of last colonoscopy but believes it was >10 years ago.       Past Medical History   Diagnosis  Date   .  Hypercholesterolemia     .  Diabetes mellitus         diet-controlled   .  Poor short term memory         on Aricept   .  Barrett's esophagus     .  S/P endoscopy  August 2007       Dr. Darrick Penna: chronic active gastritis, +H.pylori, ? treatment?, short-segment  Barrett's   .  S/P colonoscopy         ? awaiting records?       Past Surgical History   Procedure  Date   .  Neck surgery     .  Tonsillectomy     .  External ear surgery     .  Hand surgery     .  Vasectomy     .  Anal fissure repair     .  Skin cancer removal         Current Outpatient Prescriptions   Medication  Sig  Dispense  Refill   .  aspirin 81 MG tablet  Take 81 mg by mouth daily.           .  calcium carbonate (OS-CAL) 600 MG TABS  Take 600 mg by mouth 2 (two) times daily with a meal.           .  Cholecalciferol (VITAMIN D3) 1000 UNITS CAPS  Take by mouth.           .  COD LIVER OIL PO  Take  by mouth.           .  donepezil (ARICEPT) 10 MG tablet  Take 10 mg by mouth at bedtime.          .  Garlic 1000 MG CAPS  Take 1,000 mg by mouth daily.           .  Methylsulfonylmethane (MSM) 1500 MG TABS  Take 1,500 mg by mouth daily.           .  Multiple Vitamin (MULTIVITAMIN) capsule  Take 1 capsule by mouth daily.           Marland Kitchen  omeprazole (PRILOSEC) 20 MG capsule  Take 20 mg by mouth daily.           .  simvastatin (ZOCOR) 40 MG tablet  Take 40 mg by mouth at bedtime.          Marland Kitchen  UNKNOWN TO PATIENT  Grape seed complex 1 tablet daily            .  zinc gluconate 50 MG tablet  Take 50 mg by mouth daily.               Allergies as of 06/21/2011 - Review Complete 06/21/2011   Allergen  Reaction  Noted   .  Aspirin       .  Codeine           Family History   Problem  Relation  Age of Onset   .  Colon cancer  Neg Hx         History       Social History   .  Marital Status:  Married       Spouse Name:  N/A       Number of Children:  N/A   .  Years of Education:  N/A       Occupational History   .  Not on file.       Social History Main Topics   .  Smoking status:  Former Games developer   .  Smokeless tobacco:  Not on file     Comment: smoked age [age]   .  Alcohol Use:  No   .  Drug Use:  No   .  Sexually Active:  Not on file       Other Topics  Concern   .   Not on file       Social History Narrative   .  No narrative on file      Review of Systems: Gen: Denies any fever, chills, fatigue, weight loss, lack of appetite.   CV: Denies chest pain, heart palpitations, peripheral edema, syncope.   Resp: Denies shortness of breath at rest or with exertion. Denies wheezing or cough.   GI: SEE HPI GU : Denies urinary burning, urinary frequency, urinary hesitancy MS: Denies joint pain, muscle weakness, cramps, or limitation of movement.   Derm: Denies rash, itching, dry skin Psych: Denies depression, anxiety, memory loss, and confusion Heme: Denies bruising, bleeding, and enlarged lymph nodes.   Physical Exam: BP 108/62  Pulse 57  Temp(Src) 97.9 F (36.6 C) (Temporal)  Ht 6\' 2"  (1.88 m)  Wt 250 lb 12.8 oz (113.762 kg)  BMI 32.20 kg/m2 General:   Alert and oriented. Pleasant and cooperative. Well-nourished and well-developed.   Head:  Normocephalic and atraumatic. Eyes:  Without icterus, sclera clear and conjunctiva pink.   Ears:  Normal auditory acuity. Nose:  No deformity,  discharge,  or lesions. Mouth:  No deformity or lesions, oral mucosa pink.   Neck:  Supple, without mass or thyromegaly. Lungs:  Clear to auscultation bilaterally. No wheezes, rales, or rhonchi. No distress.   Heart:  S1, S2 present without murmurs appreciated.   Abdomen:  +BS, soft, non-tender and non-distended. No HSM noted. No guarding or rebound. No masses appreciated.   Rectal:  Deferred   Msk:  Symmetrical without gross deformities. Normal posture. Extremities:  Without clubbing or edema. Neurologic:  Alert and  oriented x4;  grossly normal neurologically. Skin:  Intact without significant lesions or rashes. Cervical Nodes:  No significant cervical adenopathy. Psych:  Alert and cooperative. Normal mood and affect.         Glendora Score  06/21/2011  4:39 PM  Signed Cc to PCP     Barrett esophagus - Joshua Halls, Joshua Khan  06/21/2011  3:11 PM   Signed 65 year old male with hx of short-segment Barrett's esophagus on EGD in 2007 as well as +H.pylori on path. However, he is unsure if he completed treatment for this or not. He complains of dysphagia and belching for the past few months. On Prilosec daily. Only one episode of epigastric pain, resolved on own. No N/V. Will need EGD for Barrett's surveillance, biopsy for H.pylori at time of EGD. Will need treatment if positive. Pt states he is overdue for surveillance as his dog passed away a few years ago and missed an appt; apparently lost to follow-up.   Proceed with upper endoscopy in the near future with Dr. Darrick Penna. The risks, benefits, and alternatives have been discussed in detail with patient. They have stated understanding and desire to proceed.  Continue Prilosec     Loose stools - Joshua Halls, Joshua Khan  06/21/2011  3:13 PM  Signed Change in frequency of stools over past month or so. Only medication change is cod liver oil, but he states loose stools were occuring prior to starting this medication. Does not want to stop this currently. No recent abx. Postprandial component. Not eating high fiber diet. Doubt infectious etiology. Will assess with stool studies, add high fiber diet. Retrieve last colonoscopy reports. As pt states this was greater than 10 years ago, will add TCS at time of EGD.   Cdiff PCR, Giardia, lactoferrin, Stool culture High fiber diet handout Benefiber if necessary Proceed with colonoscopy with Dr. Darrick Penna in the near future. The risks, benefits, and alternatives have been discussed in detail with the patient. They state understanding and desire to proceed.  Retrieve colonoscopy reports

## 2011-06-29 NOTE — Interval H&P Note (Signed)
History and Physical Interval Note:   06/29/2011   9:59 AM   Joshua Khan  has presented today for surgery, with the diagnosis of BARRETTS ESO- SCREENING TCS  The various methods of treatment have been discussed with the patient and family. After consideration of risks, benefits and other options for treatment, the patient has consented to  Procedure(s): COLONOSCOPY WITH ESOPHAGOGASTRODUODENOSCOPY (EGD) as a surgical intervention .  The patients' history has been reviewed, patient examined, no change in status, stable for surgery.  I have reviewed the patients' chart and labs.  Questions were answered to the patient's satisfaction.     Jonette Eva  MD   THE PATIENT WAS EXAMINED AND THERE IS NO CHANGE IN THE PATIENT'S CONDITION SINCE THE ORIGINAL H&P WAS COMPLETED.

## 2011-06-29 NOTE — Progress Notes (Signed)
Quick Note:  All negative. For EGD and colon 10/31 with [facility]. ______

## 2011-09-09 DIAGNOSIS — G4733 Obstructive sleep apnea (adult) (pediatric): Secondary | ICD-10-CM | POA: Diagnosis not present

## 2011-09-09 DIAGNOSIS — K219 Gastro-esophageal reflux disease without esophagitis: Secondary | ICD-10-CM | POA: Diagnosis not present

## 2011-09-09 DIAGNOSIS — E119 Type 2 diabetes mellitus without complications: Secondary | ICD-10-CM | POA: Diagnosis not present

## 2011-09-09 DIAGNOSIS — R030 Elevated blood-pressure reading, without diagnosis of hypertension: Secondary | ICD-10-CM | POA: Diagnosis not present

## 2011-09-27 ENCOUNTER — Encounter: Payer: Self-pay | Admitting: Gastroenterology

## 2011-09-28 ENCOUNTER — Ambulatory Visit (INDEPENDENT_AMBULATORY_CARE_PROVIDER_SITE_OTHER): Payer: Medicare Other | Admitting: Gastroenterology

## 2011-09-28 ENCOUNTER — Encounter: Payer: Self-pay | Admitting: Gastroenterology

## 2011-09-28 VITALS — BP 128/70 | HR 61 | Temp 97.5°F | Ht 74.0 in | Wt 259.2 lb

## 2011-09-28 DIAGNOSIS — R195 Other fecal abnormalities: Secondary | ICD-10-CM | POA: Diagnosis not present

## 2011-09-28 DIAGNOSIS — K2270 Barrett's esophagus without dysplasia: Secondary | ICD-10-CM | POA: Diagnosis not present

## 2011-09-28 NOTE — Patient Instructions (Signed)
Continue taking Prilosec (Omeprazole) twice a day.   Start eating a high fiber diet. See handout. You may take supplemental fiber daily such as metamucil or benefiber. Generic options are available over-the-counter as well.   We will see you back in 1 year. If you have any problems with your bowel habits or further difficulty swallowing, call us so we may see you sooner.

## 2011-10-03 ENCOUNTER — Encounter: Payer: Self-pay | Admitting: Gastroenterology

## 2011-10-03 NOTE — Progress Notes (Signed)
REVIEWED.  

## 2011-10-03 NOTE — Progress Notes (Signed)
Faxed to PCP

## 2011-10-03 NOTE — Assessment & Plan Note (Signed)
Improving. Noted more as "soft". No abdominal pain. Path negative for microscopic colitis. Severe diverticulosis noted. Needs TCS in Oct 2017. Not following high fiber diet.   HF diet Supplemental fiber choices described. Samples provided Return in 1 year or sooner if needed

## 2011-10-03 NOTE — Assessment & Plan Note (Signed)
Surveillance EGD now up-to-date. No dysplasia noted on path. Dysphagia resolved. Continue BID PPI dosing. Needs surveillance in 3 years.

## 2011-10-03 NOTE — Progress Notes (Signed)
Referring Provider: Ignatius Specking., MD Primary Care Physician:  Ignatius Specking., MD, MD Primary Gastroenterologist: Dr. Darrick Penna   Chief Complaint  Patient presents with  . Follow-up    HPI:   [name] presents today in follow-up after TCS and EGD. Hx of Barrett's, no dysplasia on recent EGD, TCS negative for microscopic colitis, needs again in Oct 2017. Denies dysphagia. Continues to have 5-6 soft BMs per day, no diarrhea. Not eating a HF diet. States notes BM slowing up over the past week. No abdominal pain. Continues to work on [employer].   Past Medical History  Diagnosis Date  . Hypercholesterolemia   . 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 Barrett's esophagus   . S/P endoscopy August 2007    Dr. Darrick Penna: chronic active gastritis, +H.pylori, ? treatment?, short-segment Barrett's  . S/P colonoscopy     ? awaiting records?  . Sleep apnea     yes c pap  . Cancer     basal ceel removed ,  one on head and one on right arm  . Blood transfusion     as child  . GERD (gastroesophageal reflux disease)     takes  prilosec  . Arthritis     hands  . Anxiety     yes here latley  , no meds  . Diabetes mellitus     ,  poor circulation  . Pneumonia     3 times as baby  . Hiatal hernia     Past Surgical History  Procedure Date  . Neck surgery   . External ear surgery   . Hand surgery   . Vasectomy   . Anal fissure repair   . Skin cancer removal   . Tonsillectomy     30's  . Esophagogastroduodenoscopy 06/29/11    barrett's esophagus/hiatal hernia/mild gastristis, s/p Savary dilation, path with no sprue, mild chronic gastritis noted, negative H.pylori, intestinal metaplasia consistent with barrett's, no dysplasia  . Colonoscopy 06/29/11    internal hemorrhoids/severe diverticulosis, path negative for microscopic colitis repeat Oct 2017 with 2 days of clear liquids and Moviprep    Current Outpatient Prescriptions  Medication Sig Dispense Refill  . aspirin 81 MG tablet Take 81 mg by  mouth daily.       . calcium carbonate (OS-CAL) 600 MG TABS Take 600 mg by mouth 2 (two) times daily with a meal.        . Cholecalciferol (VITAMIN D3) 1000 UNITS CAPS Take by mouth.        . COD LIVER OIL PO Take 5 mLs by mouth daily.       . Garlic 1000 MG CAPS Take 1,000 mg by mouth daily.        . Methylsulfonylmethane (MSM) 1500 MG TABS Take 1,500 mg by mouth daily.        . Multiple Vitamin (MULTIVITAMIN) capsule Take 1 capsule by mouth daily.        Marland Kitchen omeprazole (PRILOSEC) 20 MG capsule Take 20 mg by mouth 2 (two) times daily.       . simvastatin (ZOCOR) 40 MG tablet Take 40 mg by mouth at bedtime.       Marland Kitchen UNKNOWN TO PATIENT Grape seed complex 1 tablet daily        . zinc gluconate 50 MG tablet Take 50 mg by mouth daily.        Marland Kitchen aspirin EC 81 MG tablet Take 81 mg by mouth daily.        Marland Kitchen  donepezil (ARICEPT) 10 MG tablet Take 10 mg by mouth at bedtime.         Allergies as of 09/28/2011 - Review Complete 09/28/2011  Allergen Reaction Noted  . Aspirin    . Codeine    . Sulfa antibiotics  06/29/2011    Family History  Problem Relation Age of Onset  . Colon cancer Neg Hx     History   Social History  . Marital Status: Married    Spouse Name: N/A    Number of Children: N/A  . Years of Education: N/A   Social History Main Topics  . Smoking status: Former Games developer  . Smokeless tobacco: None   Comment: smoked age [age]  . Alcohol Use: No  . Drug Use: No  . Sexually Active: Not Currently   Other Topics Concern  . None   Social History Narrative  . None    Review of Systems: Gen: Denies fever, chills, anorexia. Denies fatigue, weakness, weight loss.  CV: Denies chest pain, palpitations, syncope, peripheral edema, and claudication. Resp: Denies dyspnea at rest, cough, wheezing, coughing up blood, and pleurisy. GI: Denies vomiting blood, jaundice, and fecal incontinence.   Denies dysphagia or odynophagia. Derm: Denies rash, itching, dry skin Psych: Denies  depression, anxiety, memory loss, confusion. No homicidal or suicidal ideation.  Heme: Denies bruising, bleeding, and enlarged lymph nodes.  Physical Exam: BP 128/70  Pulse 61  Temp(Src) 97.5 F (36.4 C) (Temporal)  Ht 6\' 2"  (1.88 m)  Wt 259 lb 3.2 oz (117.572 kg)  BMI 33.28 kg/m2 General:   Alert and oriented. No distress noted. Pleasant and cooperative.  Head:  Normocephalic and atraumatic. Eyes:  Conjuctiva clear without scleral icterus. Mouth:  Oral mucosa pink and moist. Good dentition. No lesions. Neck:  Supple, without mass or thyromegaly. Heart:  S1, S2 present without murmurs, rubs, or gallops. Regular rate and rhythm. Abdomen:  +BS, soft, non-tender and non-distended. No rebound or guarding. No HSM or masses noted. Msk:  Symmetrical without gross deformities. Normal posture. Extremities:  Without edema. Neurologic:  Alert and  oriented x4;  grossly normal neurologically. Skin:  Intact without significant lesions or rashes. Cervical Nodes:  No significant cervical adenopathy. Psych:  Alert and cooperative. Normal mood and affect.

## 2011-10-24 DIAGNOSIS — K219 Gastro-esophageal reflux disease without esophagitis: Secondary | ICD-10-CM | POA: Diagnosis not present

## 2011-10-24 DIAGNOSIS — G4733 Obstructive sleep apnea (adult) (pediatric): Secondary | ICD-10-CM | POA: Diagnosis not present

## 2011-11-01 DIAGNOSIS — G4733 Obstructive sleep apnea (adult) (pediatric): Secondary | ICD-10-CM | POA: Diagnosis not present

## 2011-11-01 DIAGNOSIS — E7800 Pure hypercholesterolemia, unspecified: Secondary | ICD-10-CM | POA: Diagnosis not present

## 2011-11-01 DIAGNOSIS — E119 Type 2 diabetes mellitus without complications: Secondary | ICD-10-CM | POA: Diagnosis not present

## 2011-11-01 DIAGNOSIS — K219 Gastro-esophageal reflux disease without esophagitis: Secondary | ICD-10-CM | POA: Diagnosis not present

## 2012-02-10 DIAGNOSIS — M47812 Spondylosis without myelopathy or radiculopathy, cervical region: Secondary | ICD-10-CM | POA: Diagnosis not present

## 2012-02-10 DIAGNOSIS — Z981 Arthrodesis status: Secondary | ICD-10-CM | POA: Diagnosis not present

## 2012-02-10 DIAGNOSIS — K219 Gastro-esophageal reflux disease without esophagitis: Secondary | ICD-10-CM | POA: Diagnosis not present

## 2012-02-10 DIAGNOSIS — M542 Cervicalgia: Secondary | ICD-10-CM | POA: Diagnosis not present

## 2012-02-10 DIAGNOSIS — E119 Type 2 diabetes mellitus without complications: Secondary | ICD-10-CM | POA: Diagnosis not present

## 2012-02-10 DIAGNOSIS — E7800 Pure hypercholesterolemia, unspecified: Secondary | ICD-10-CM | POA: Diagnosis not present

## 2012-02-10 DIAGNOSIS — L089 Local infection of the skin and subcutaneous tissue, unspecified: Secondary | ICD-10-CM | POA: Diagnosis not present

## 2012-02-10 DIAGNOSIS — G4733 Obstructive sleep apnea (adult) (pediatric): Secondary | ICD-10-CM | POA: Diagnosis not present

## 2012-04-06 DIAGNOSIS — H2510 Age-related nuclear cataract, unspecified eye: Secondary | ICD-10-CM | POA: Diagnosis not present

## 2012-04-06 DIAGNOSIS — IMO0002 Reserved for concepts with insufficient information to code with codable children: Secondary | ICD-10-CM | POA: Diagnosis not present

## 2012-04-06 DIAGNOSIS — H353190 Nonexudative age-related macular degeneration, unspecified eye, stage unspecified: Secondary | ICD-10-CM | POA: Diagnosis not present

## 2012-04-16 DIAGNOSIS — H663X9 Other chronic suppurative otitis media, unspecified ear: Secondary | ICD-10-CM | POA: Diagnosis not present

## 2012-05-24 DIAGNOSIS — Z23 Encounter for immunization: Secondary | ICD-10-CM | POA: Diagnosis not present

## 2012-06-25 DIAGNOSIS — G4733 Obstructive sleep apnea (adult) (pediatric): Secondary | ICD-10-CM | POA: Diagnosis not present

## 2012-07-04 DIAGNOSIS — R413 Other amnesia: Secondary | ICD-10-CM | POA: Diagnosis not present

## 2012-07-04 DIAGNOSIS — E119 Type 2 diabetes mellitus without complications: Secondary | ICD-10-CM | POA: Diagnosis not present

## 2012-07-04 DIAGNOSIS — R5383 Other fatigue: Secondary | ICD-10-CM | POA: Diagnosis not present

## 2012-08-16 ENCOUNTER — Encounter: Payer: Self-pay | Admitting: *Deleted

## 2012-09-10 DIAGNOSIS — N489 Disorder of penis, unspecified: Secondary | ICD-10-CM | POA: Diagnosis not present

## 2012-09-10 DIAGNOSIS — R351 Nocturia: Secondary | ICD-10-CM | POA: Diagnosis not present

## 2012-09-10 DIAGNOSIS — R350 Frequency of micturition: Secondary | ICD-10-CM | POA: Diagnosis not present

## 2012-11-05 DIAGNOSIS — H908 Mixed conductive and sensorineural hearing loss, unspecified: Secondary | ICD-10-CM | POA: Diagnosis not present

## 2012-11-05 DIAGNOSIS — H9210 Otorrhea, unspecified ear: Secondary | ICD-10-CM | POA: Diagnosis not present

## 2012-11-08 DIAGNOSIS — H6120 Impacted cerumen, unspecified ear: Secondary | ICD-10-CM | POA: Diagnosis not present

## 2012-12-24 DIAGNOSIS — E669 Obesity, unspecified: Secondary | ICD-10-CM | POA: Diagnosis not present

## 2012-12-24 DIAGNOSIS — G4733 Obstructive sleep apnea (adult) (pediatric): Secondary | ICD-10-CM | POA: Diagnosis not present

## 2012-12-25 ENCOUNTER — Encounter: Payer: Self-pay | Admitting: Physician Assistant

## 2012-12-25 DIAGNOSIS — E119 Type 2 diabetes mellitus without complications: Secondary | ICD-10-CM | POA: Diagnosis not present

## 2012-12-25 DIAGNOSIS — Z833 Family history of diabetes mellitus: Secondary | ICD-10-CM | POA: Diagnosis not present

## 2012-12-25 DIAGNOSIS — Z7982 Long term (current) use of aspirin: Secondary | ICD-10-CM | POA: Diagnosis not present

## 2012-12-25 DIAGNOSIS — R55 Syncope and collapse: Secondary | ICD-10-CM | POA: Diagnosis not present

## 2012-12-25 DIAGNOSIS — J984 Other disorders of lung: Secondary | ICD-10-CM | POA: Diagnosis not present

## 2012-12-25 DIAGNOSIS — I498 Other specified cardiac arrhythmias: Secondary | ICD-10-CM | POA: Diagnosis not present

## 2012-12-25 DIAGNOSIS — Z79899 Other long term (current) drug therapy: Secondary | ICD-10-CM | POA: Diagnosis not present

## 2012-12-25 DIAGNOSIS — K219 Gastro-esophageal reflux disease without esophagitis: Secondary | ICD-10-CM | POA: Diagnosis not present

## 2012-12-25 DIAGNOSIS — Z885 Allergy status to narcotic agent status: Secondary | ICD-10-CM | POA: Diagnosis not present

## 2012-12-25 DIAGNOSIS — G4733 Obstructive sleep apnea (adult) (pediatric): Secondary | ICD-10-CM | POA: Diagnosis not present

## 2012-12-25 DIAGNOSIS — R079 Chest pain, unspecified: Secondary | ICD-10-CM | POA: Diagnosis not present

## 2012-12-25 DIAGNOSIS — Z6832 Body mass index (BMI) 32.0-32.9, adult: Secondary | ICD-10-CM | POA: Diagnosis not present

## 2012-12-25 DIAGNOSIS — R51 Headache: Secondary | ICD-10-CM | POA: Diagnosis not present

## 2012-12-25 DIAGNOSIS — R0789 Other chest pain: Secondary | ICD-10-CM | POA: Diagnosis not present

## 2012-12-25 DIAGNOSIS — E669 Obesity, unspecified: Secondary | ICD-10-CM | POA: Diagnosis not present

## 2012-12-25 DIAGNOSIS — R42 Dizziness and giddiness: Secondary | ICD-10-CM | POA: Diagnosis not present

## 2012-12-25 DIAGNOSIS — E785 Hyperlipidemia, unspecified: Secondary | ICD-10-CM | POA: Diagnosis not present

## 2012-12-25 DIAGNOSIS — E7800 Pure hypercholesterolemia, unspecified: Secondary | ICD-10-CM | POA: Diagnosis not present

## 2012-12-25 DIAGNOSIS — Z87891 Personal history of nicotine dependence: Secondary | ICD-10-CM | POA: Diagnosis not present

## 2012-12-26 ENCOUNTER — Encounter: Payer: Self-pay | Admitting: Physician Assistant

## 2012-12-26 ENCOUNTER — Encounter: Payer: Self-pay | Admitting: Cardiology

## 2012-12-26 DIAGNOSIS — R079 Chest pain, unspecified: Secondary | ICD-10-CM

## 2012-12-26 DIAGNOSIS — I495 Sick sinus syndrome: Secondary | ICD-10-CM | POA: Diagnosis not present

## 2012-12-27 ENCOUNTER — Ambulatory Visit: Payer: Medicare Other | Admitting: *Deleted

## 2012-12-27 DIAGNOSIS — R001 Bradycardia, unspecified: Secondary | ICD-10-CM

## 2013-01-01 ENCOUNTER — Encounter: Payer: Self-pay | Admitting: *Deleted

## 2013-01-02 ENCOUNTER — Telehealth: Payer: Self-pay | Admitting: Cardiology

## 2013-01-02 NOTE — Telephone Encounter (Signed)
Results from test are wanted by patient -srs

## 2013-01-02 NOTE — Telephone Encounter (Signed)
Patient informed by Hoover Brunette.

## 2013-01-08 DIAGNOSIS — G4733 Obstructive sleep apnea (adult) (pediatric): Secondary | ICD-10-CM | POA: Diagnosis not present

## 2013-01-08 DIAGNOSIS — E7800 Pure hypercholesterolemia, unspecified: Secondary | ICD-10-CM | POA: Diagnosis not present

## 2013-01-08 DIAGNOSIS — K219 Gastro-esophageal reflux disease without esophagitis: Secondary | ICD-10-CM | POA: Diagnosis not present

## 2013-01-08 DIAGNOSIS — R51 Headache: Secondary | ICD-10-CM | POA: Diagnosis not present

## 2013-01-08 DIAGNOSIS — H6690 Otitis media, unspecified, unspecified ear: Secondary | ICD-10-CM | POA: Diagnosis not present

## 2013-01-08 DIAGNOSIS — R413 Other amnesia: Secondary | ICD-10-CM | POA: Diagnosis not present

## 2013-01-08 DIAGNOSIS — E119 Type 2 diabetes mellitus without complications: Secondary | ICD-10-CM | POA: Diagnosis not present

## 2013-01-14 ENCOUNTER — Encounter: Payer: Self-pay | Admitting: Cardiology

## 2013-01-14 DIAGNOSIS — R55 Syncope and collapse: Secondary | ICD-10-CM | POA: Insufficient documentation

## 2013-01-14 DIAGNOSIS — R001 Bradycardia, unspecified: Secondary | ICD-10-CM | POA: Insufficient documentation

## 2013-01-14 DIAGNOSIS — I2510 Atherosclerotic heart disease of native coronary artery without angina pectoris: Secondary | ICD-10-CM | POA: Insufficient documentation

## 2013-01-14 DIAGNOSIS — I77810 Thoracic aortic ectasia: Secondary | ICD-10-CM | POA: Insufficient documentation

## 2013-01-14 DIAGNOSIS — R9430 Abnormal result of cardiovascular function study, unspecified: Secondary | ICD-10-CM | POA: Insufficient documentation

## 2013-01-14 DIAGNOSIS — F419 Anxiety disorder, unspecified: Secondary | ICD-10-CM | POA: Insufficient documentation

## 2013-01-17 ENCOUNTER — Ambulatory Visit (INDEPENDENT_AMBULATORY_CARE_PROVIDER_SITE_OTHER): Payer: Medicare Other | Admitting: Cardiology

## 2013-01-17 ENCOUNTER — Encounter: Payer: Self-pay | Admitting: *Deleted

## 2013-01-17 ENCOUNTER — Encounter: Payer: Self-pay | Admitting: Cardiology

## 2013-01-17 VITALS — BP 141/78 | HR 46 | Ht 73.0 in | Wt 252.4 lb

## 2013-01-17 DIAGNOSIS — I2510 Atherosclerotic heart disease of native coronary artery without angina pectoris: Secondary | ICD-10-CM | POA: Diagnosis not present

## 2013-01-17 DIAGNOSIS — R0989 Other specified symptoms and signs involving the circulatory and respiratory systems: Secondary | ICD-10-CM

## 2013-01-17 DIAGNOSIS — R079 Chest pain, unspecified: Secondary | ICD-10-CM

## 2013-01-17 DIAGNOSIS — Z01810 Encounter for preprocedural cardiovascular examination: Secondary | ICD-10-CM

## 2013-01-17 DIAGNOSIS — I498 Other specified cardiac arrhythmias: Secondary | ICD-10-CM | POA: Diagnosis not present

## 2013-01-17 DIAGNOSIS — I77810 Thoracic aortic ectasia: Secondary | ICD-10-CM

## 2013-01-17 DIAGNOSIS — R9430 Abnormal result of cardiovascular function study, unspecified: Secondary | ICD-10-CM

## 2013-01-17 DIAGNOSIS — R55 Syncope and collapse: Secondary | ICD-10-CM

## 2013-01-17 DIAGNOSIS — R413 Other amnesia: Secondary | ICD-10-CM | POA: Diagnosis not present

## 2013-01-17 DIAGNOSIS — I471 Supraventricular tachycardia: Secondary | ICD-10-CM | POA: Insufficient documentation

## 2013-01-17 DIAGNOSIS — F0391 Unspecified dementia with behavioral disturbance: Secondary | ICD-10-CM | POA: Insufficient documentation

## 2013-01-17 NOTE — Assessment & Plan Note (Signed)
Patient says that he has decreased memory. Hopefully this will also be assessed with his neurologic evaluation.

## 2013-01-17 NOTE — Assessment & Plan Note (Signed)
The etiology of his pre-syncope is unclear. There is still no definite cardiac proof of a problem. We will be proceeding with cardiac catheterization. It will also be very important for him to follow through on his neurologic evaluation that is scheduled in a week.

## 2013-01-17 NOTE — Assessment & Plan Note (Signed)
We know from his echo April, 2014 that his ejection fraction is 60%.

## 2013-01-17 NOTE — Assessment & Plan Note (Signed)
Patient had nonobstructive coronary disease by cath in 2007. With the spells that he is having he had a nuclear scan in April, 2014. There was no definite scar or ischemia. He continues to describe chest pain. After careful discussion with the patient is wife we will proceed with outpatient diagnostic cath.

## 2013-01-17 NOTE — Patient Instructions (Addendum)
Your physician has requested that you have a cardiac catheterization. Cardiac catheterization is used to diagnose and/or treat various heart conditions. Doctors may recommend this procedure for a number of different reasons. The most common reason is to evaluate chest pain. Chest pain can be a symptom of coronary artery disease (CAD), and cardiac catheterization can show whether plaque is narrowing or blocking your heart's arteries. This procedure is also used to evaluate the valves, as well as measure the blood flow and oxygen levels in different parts of your heart. For further information please visit [email or web address] Please follow instruction sheet, as given. Patient seen today May, 22 2014 in our office by Dr. Myrtis Ser. This office note has been sent to Dr. Terrace Arabia.

## 2013-01-17 NOTE — Progress Notes (Signed)
HPI  The patient is seen after hospitalization at [facility]. There was question of a presyncopal spell. He had a nuclear scan that showed no definite ischemia. He had an outpatient 48 hour Holter. I have reviewed this report completely. The Holter showed PACs and PVCs. There was one run of 5 beats of atrial tachycardia with a rate of 120. There was no atrial fibrillation or a true flutter.  As part of today's evaluation I have reviewed the hospital cardiology consult in the discharge summary. I've reviewed the nuclear scan data. I reviewed the Holter report.  The patient is here with his wife. He mentions that he thinks he is having memory difficulties. In addition he is having recurrent spells describes as follows : on several occasions during the day he has an unusual smell. After this he feels uncomfortable in his chest and gets a wave of burning that goes down to his scrotum. He does not have to urinate. These spells pass over time. Allergies  Allergen Reactions  . Aspirin     Cannot take if not coated, makes stomach burn  . Codeine     REACTION: UNKNOWN REACTION  . Sulfa Antibiotics     Not sure of reaction    Current Outpatient Prescriptions  Medication Sig Dispense Refill  . aspirin EC 81 MG tablet Take 81 mg by mouth daily.        . Calcium Carbonate-Vit D-Min (CALCIUM 1200 PO) Take by mouth daily.      . Cholecalciferol (VITAMIN D3) 1000 UNITS CAPS Take by mouth.        . COD LIVER OIL PO Take 5 mLs by mouth daily.       . Garlic 1000 MG CAPS Take 1,000 mg by mouth daily.        . Methylsulfonylmethane (MSM) 1500 MG TABS Take 1,500 mg by mouth daily.        . Multiple Vitamin (MULTIVITAMIN) capsule Take 1 capsule by mouth daily.        Marland Kitchen omeprazole (PRILOSEC) 20 MG capsule Take 20 mg by mouth 2 (two) times daily.       . simvastatin (ZOCOR) 40 MG tablet Take 40 mg by mouth at bedtime.       Marland Kitchen zinc gluconate 50 MG tablet Take 50 mg by mouth daily.         No current  facility-administered medications for this visit.    History   Social History  . Marital Status: Married    Spouse Name: N/A    Number of Children: N/A  . Years of Education: N/A   Occupational History  . Not on file.   Social History Main Topics  . Smoking status: Former Games developer  . Smokeless tobacco: Not on file     Comment: smoked age [age]  . Alcohol Use: No  . Drug Use: No  . Sexually Active: Not Currently   Other Topics Concern  . Not on file   Social History Narrative  . No narrative on file    Family History  Problem Relation Age of Onset  . Colon cancer Neg Hx     Past Medical History  Diagnosis Date  . Hypercholesterolemia   . 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 Barrett's esophagus   . S/P endoscopy August 2007    Dr. Darrick Penna: chronic active gastritis, +H.pylori, ? treatment?, short-segment Barrett's  . S/P colonoscopy     ? awaiting records?  Marland Kitchen  Sleep apnea     yes c pap  . Cancer     basal ceel removed ,  one on head and one on right arm  . Blood transfusion     as child  . GERD (gastroesophageal reflux disease)     takes  prilosec  . Arthritis     hands  . Anxiety     yes here latley  , no meds  . Diabetes mellitus     ,  poor circulation  . Pneumonia     3 times as baby  . Hiatal hernia   . CAD (coronary artery disease)     Nonobstructive coronary disease by cath, 2007  . Pre-syncope     May, 2015  . Ejection fraction     EF 60%, catheterization, 2007  //   EF 60%, echo, April, 2014 while in hospital  . Sinus bradycardia     Asymptomatic in 2007  . Aortic root dilatation     Mild, echo, April, 2014  . SVT (supraventricular tachycardia)     5 beats atrial tachycardia 48 hour Holter, May, 2014, no atrial fibrillation, no atrial flutter.    Past Surgical History  Procedure Laterality Date  . Neck surgery    . External ear surgery    . Hand surgery    . Vasectomy    . Anal fissure repair    . Skin cancer removal    .  Tonsillectomy      30's  . Esophagogastroduodenoscopy  06/29/11    barrett's esophagus/hiatal hernia/mild gastristis, s/p Savary dilation, path with no sprue, mild chronic gastritis noted, negative H.pylori, intestinal metaplasia consistent with barrett's, no dysplasia  . Colonoscopy  06/29/11    internal hemorrhoids/severe diverticulosis, path negative for microscopic colitis repeat Oct 2017 with 2 days of clear liquids and Moviprep    Patient Active Problem List   Diagnosis Date Noted  . SVT (supraventricular tachycardia)   . CAD (coronary artery disease)   . Pre-syncope   . Sinus bradycardia   . Ejection fraction   . Anxiety   . Aortic root dilatation   . Loose stools 06/21/2011  . 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 DM 05/19/2009  . HYPERLIPIDEMIA 05/19/2009    ROS   Patient denies fever, chills, headache, sweats, rash, change in vision, change in hearing, chest pain, cough, nausea vomiting, urinary symptoms. All other systems are reviewed and are negative.   PHYSICAL EXAM  Patient is oriented to person time and place. Affect is normal. He is here with his wife. There is no jugulovenous distention. Lungs are clear. Respiratory effort is nonlabored. Cardiac exam her vitals S1 and S2. There no clicks or significant murmurs. The abdomen is soft. There is no peripheral edema. There no musculoskeletal deformities. There no skin rashes.  Filed Vitals:   01/17/13 0932  BP: 141/78  Pulse: 46  Height: 6\' 1"  (1.854 m)  Weight: 252 lb 6.4 oz (114.488 kg)    EKG  ASSESSMENT & PLAN

## 2013-01-17 NOTE — Assessment & Plan Note (Signed)
The patient had 5 beats of atrial tachycardia. We have no evidence of these having significant tachyarrhythmias.  As part of today's evaluation I have reviewed his records extensively and updated the current medical record. I spent greater than 25 minutes with the patient. More than half of this time with direct contact with the patient and his wife.  I will be sending a copy of this note to the neurology group.

## 2013-01-17 NOTE — Assessment & Plan Note (Signed)
Patient is mild aortic root dilatation by echo April, 2014. This can be followed over time.

## 2013-01-22 ENCOUNTER — Telehealth: Payer: Self-pay | Admitting: Cardiology

## 2013-01-22 ENCOUNTER — Encounter (HOSPITAL_BASED_OUTPATIENT_CLINIC_OR_DEPARTMENT_OTHER)
Admission: RE | Disposition: A | Discharge status: Home / Self Care | Payer: Self-pay | Source: Ambulatory Visit | Attending: Cardiovascular Disease

## 2013-01-22 ENCOUNTER — Inpatient Hospital Stay (HOSPITAL_BASED_OUTPATIENT_CLINIC_OR_DEPARTMENT_OTHER)
Admission: RE | Admit: 2013-01-22 | Discharge: 2013-01-22 | Disposition: A | Discharge status: Home / Self Care | Payer: Medicare Other | Source: Ambulatory Visit | Attending: Cardiovascular Disease | Admitting: Cardiovascular Disease

## 2013-01-22 DIAGNOSIS — R9439 Abnormal result of other cardiovascular function study: Secondary | ICD-10-CM | POA: Diagnosis not present

## 2013-01-22 DIAGNOSIS — I2510 Atherosclerotic heart disease of native coronary artery without angina pectoris: Secondary | ICD-10-CM | POA: Diagnosis not present

## 2013-01-22 SURGERY — JV LEFT HEART CATHETERIZATION WITH CORONARY ANGIOGRAM

## 2013-01-22 MED ORDER — SODIUM CHLORIDE 0.9 % IV SOLN: 1.0000 mL/kg/h | INTRAVENOUS | Status: DC

## 2013-01-22 MED ORDER — ONDANSETRON HCL 4 MG/2ML IJ SOLN: 4.0000 mg | Freq: Four times a day (QID) | INTRAMUSCULAR | Status: DC | PRN

## 2013-01-22 MED ORDER — ACETAMINOPHEN 325 MG PO TABS: 650.0000 mg | ORAL_TABLET | ORAL | Status: DC | PRN

## 2013-01-22 NOTE — Progress Notes (Signed)
Pt received from cath lab alert and oriented.  Pt denies any discomfort at this time.  Dr Dayle Points in to talk to pt and family about the results of test and plan of care.  Pt able to tolerated fluids.

## 2013-01-22 NOTE — Telephone Encounter (Signed)
Patient cancelled his cath due to thinking he received letter stating that he had to pay for procedure before he could have it done

## 2013-01-22 NOTE — OR Nursing (Signed)
+  Allen's test right hand 

## 2013-01-22 NOTE — CV Procedure (Signed)
   Cardiac Catheterization Procedure Note  Name: Joshua Khan MRN: [id number] DOB: [birth year]  Procedure: Left Heart Cath, Selective Coronary Angiography, LV angiography  Indication: Abnormal stress test   Procedural Details: The right wrist was prepped, draped, and anesthetized with 1% lidocaine. Using the modified Seldinger technique, a 5 French sheath was introduced into the right radial artery. 3 mg of verapamil was administered through the sheath, weight-based unfractionated heparin was administered intravenously. Standard Judkins catheters were used for selective coronary angiography and left ventriculography. The procedure was technically difficult because of subclavian tortuosity. I was unable to reach either coronary with standard catheters. For the right coronary artery, the vessel was selectively engaged with a 5 [place] JR 4 guide catheter with a 0.035 inch wire inside of the catheter to straighten the vessel. The left coronary was injected with a JL4 5 [place] guide catheter. Catheter exchanges were performed over an exchange length guidewire. There were no immediate procedural complications. A TR band was used for radial hemostasis at the completion of the procedure.  The patient was transferred to the post catheterization recovery area for further monitoring.  Procedural Findings: Hemodynamics: AO 117/64 LV 114/18  Coronary angiography: Coronary dominance: right  Left mainstem: Irregular but no significant stenosis.  Left anterior descending (LAD): The LAD is diffusely irregular. The vessel does not have any high-grade obstructive lesions. The first diagonal is large in caliber. The second and third diagonals are small in caliber. The mid LAD has diffuse 30-40% stenosis. The vessel wraps around the left ventricular apex.  Left circumflex (LCx): The intermediate and AV circumflex are patent.  Right coronary artery (RCA): The vessel is ectatic throughout. There are 30-40% lesions  throughout the mid and distal vessel. The PDA is patent. The posterior AV segment is ectatic leading into 2 small posterolateral branches. There are no severe stenoses throughout the course of the right coronary artery.  Left ventriculography: Left ventricular systolic function is normal, LVEF is estimated at 55-65%, there is no significant mitral regurgitation   Final Conclusions:   1. Diffuse coronary ectasia with nonobstructive stenosis primarily in the LAD and right coronary artery 2. Normal left ventricular systolic function  Tonny Bollman 01/22/2013, 1:43 PM

## 2013-01-22 NOTE — Telephone Encounter (Signed)
Nurse returned call to inquire why patient was cancelling his heart cath. Wife said that patient had received a letter stating that he would be required to pay money up front for the procedure and this is why he needed to cancel. Nurse informed wife and patient that he could make arrangements for any money owed but he didn't need to cancel his cath for today. Nurse explained the importance of him having the cath. Patient stated that he drank a cup of coffee at 9:00 am this morning. Nurse called cath lab to inform them about the coffee. Staff stated that it would be okay for patient to still have procedure done. Patient and wife informed that his cath isn't cancelled and he can still go for his cath on today. Wife and patient verbalized understanding of plan.

## 2013-01-22 NOTE — Interval H&P Note (Signed)
History and Physical Interval Note:  01/22/2013 5:34 PM  Joshua Khan  has presented today for surgery, with the diagnosis of cp  The various methods of treatment have been discussed with the patient and family. After consideration of risks, benefits and other options for treatment, the patient has consented to  Procedure(s): JV LEFT HEART CATHETERIZATION WITH CORONARY ANGIOGRAM (N/A) as a surgical intervention .  The patient's history has been reviewed, patient examined, no change in status, stable for surgery.  I have reviewed the patient's chart and labs.  Questions were answered to the patient's satisfaction.     Tonny Bollman

## 2013-01-22 NOTE — H&P (View-Only) (Signed)
 HPI  The patient is seen after hospitalization at [facility]. There was question of a presyncopal spell. He had a nuclear scan that showed no definite ischemia. He had an outpatient 48 hour Holter. I have reviewed this report completely. The Holter showed PACs and PVCs. There was one run of 5 beats of atrial tachycardia with a rate of 120. There was no atrial fibrillation or a true flutter.  As part of today's evaluation I have reviewed the hospital cardiology consult in the discharge summary. I've reviewed the nuclear scan data. I reviewed the Holter report.  The patient is here with his wife. He mentions that he thinks he is having memory difficulties. In addition he is having recurrent spells describes as follows : on several occasions during the day he has an unusual smell. After this he feels uncomfortable in his chest and gets a wave of burning that goes down to his scrotum. He does not have to urinate. These spells pass over time. Allergies  Allergen Reactions  . Aspirin     Cannot take if not coated, makes stomach burn  . Codeine     REACTION: UNKNOWN REACTION  . Sulfa Antibiotics     Not sure of reaction    Current Outpatient Prescriptions  Medication Sig Dispense Refill  . aspirin EC 81 MG tablet Take 81 mg by mouth daily.        . Calcium Carbonate-Vit D-Min (CALCIUM 1200 PO) Take by mouth daily.      . Cholecalciferol (VITAMIN D3) 1000 UNITS CAPS Take by mouth.        . COD LIVER OIL PO Take 5 mLs by mouth daily.       . Garlic 1000 MG CAPS Take 1,000 mg by mouth daily.        . Methylsulfonylmethane (MSM) 1500 MG TABS Take 1,500 mg by mouth daily.        . Multiple Vitamin (MULTIVITAMIN) capsule Take 1 capsule by mouth daily.        . omeprazole (PRILOSEC) 20 MG capsule Take 20 mg by mouth 2 (two) times daily.       . simvastatin (ZOCOR) 40 MG tablet Take 40 mg by mouth at bedtime.       . zinc gluconate 50 MG tablet Take 50 mg by mouth daily.         No current  facility-administered medications for this visit.    History   Social History  . Marital Status: Married    Spouse Name: N/A    Number of Children: N/A  . Years of Education: N/A   Occupational History  . Not on file.   Social History Main Topics  . Smoking status: Former Smoker  . Smokeless tobacco: Not on file     Comment: smoked age [age]  . Alcohol Use: No  . Drug Use: No  . Sexually Active: Not Currently   Other Topics Concern  . Not on file   Social History Narrative  . No narrative on file    Family History  Problem Relation Age of Onset  . Colon cancer Neg Hx     Past Medical History  Diagnosis Date  . Hypercholesterolemia   . 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 Barrett's esophagus   . S/P endoscopy August 2007    Dr. Fields: chronic active gastritis, +H.pylori, ? treatment?, short-segment Barrett's  . S/P colonoscopy     ? awaiting records?  .   Sleep apnea     yes c pap  . Cancer     basal ceel removed ,  one on head and one on right arm  . Blood transfusion     as child  . GERD (gastroesophageal reflux disease)     takes  prilosec  . Arthritis     hands  . Anxiety     yes here latley  , no meds  . Diabetes mellitus     ,  poor circulation  . Pneumonia     3 times as baby  . Hiatal hernia   . CAD (coronary artery disease)     Nonobstructive coronary disease by cath, 2007  . Pre-syncope     May, 2015  . Ejection fraction     EF 60%, catheterization, 2007  //   EF 60%, echo, April, 2014 while in hospital  . Sinus bradycardia     Asymptomatic in 2007  . Aortic root dilatation     Mild, echo, April, 2014  . SVT (supraventricular tachycardia)     5 beats atrial tachycardia 48 hour Holter, May, 2014, no atrial fibrillation, no atrial flutter.    Past Surgical History  Procedure Laterality Date  . Neck surgery    . External ear surgery    . Hand surgery    . Vasectomy    . Anal fissure repair    . Skin cancer removal    .  Tonsillectomy      30's  . Esophagogastroduodenoscopy  06/29/11    barrett's esophagus/hiatal hernia/mild gastristis, s/p Savary dilation, path with no sprue, mild chronic gastritis noted, negative H.pylori, intestinal metaplasia consistent with barrett's, no dysplasia  . Colonoscopy  06/29/11    internal hemorrhoids/severe diverticulosis, path negative for microscopic colitis repeat Oct 2017 with 2 days of clear liquids and Moviprep    Patient Active Problem List   Diagnosis Date Noted  . SVT (supraventricular tachycardia)   . CAD (coronary artery disease)   . Pre-syncope   . Sinus bradycardia   . Ejection fraction   . Anxiety   . Aortic root dilatation   . Loose stools 06/21/2011  . 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 DM 05/19/2009  . HYPERLIPIDEMIA 05/19/2009    ROS   Patient denies fever, chills, headache, sweats, rash, change in vision, change in hearing, chest pain, cough, nausea vomiting, urinary symptoms. All other systems are reviewed and are negative.   PHYSICAL EXAM  Patient is oriented to person time and place. Affect is normal. He is here with his wife. There is no jugulovenous distention. Lungs are clear. Respiratory effort is nonlabored. Cardiac exam her vitals S1 and S2. There no clicks or significant murmurs. The abdomen is soft. There is no peripheral edema. There no musculoskeletal deformities. There no skin rashes.  Filed Vitals:   01/17/13 0932  BP: 141/78  Pulse: 46  Height: 6' 1" (1.854 m)  Weight: 252 lb 6.4 oz (114.488 kg)    EKG  ASSESSMENT & PLAN   

## 2013-01-25 ENCOUNTER — Institutional Professional Consult (permissible substitution): Payer: BC Managed Care – PPO | Admitting: Neurology

## 2013-01-25 ENCOUNTER — Ambulatory Visit (INDEPENDENT_AMBULATORY_CARE_PROVIDER_SITE_OTHER): Payer: Medicare Other | Admitting: Neurology

## 2013-01-25 ENCOUNTER — Other Ambulatory Visit: Payer: Self-pay | Admitting: *Deleted

## 2013-01-25 ENCOUNTER — Encounter: Payer: Self-pay | Admitting: Neurology

## 2013-01-25 ENCOUNTER — Institutional Professional Consult (permissible substitution): Payer: Medicare Other | Admitting: Neurology

## 2013-01-25 VITALS — BP 135/81 | HR 53 | Ht 72.0 in | Wt 254.0 lb

## 2013-01-25 DIAGNOSIS — R569 Unspecified convulsions: Secondary | ICD-10-CM

## 2013-01-25 DIAGNOSIS — E119 Type 2 diabetes mellitus without complications: Secondary | ICD-10-CM | POA: Diagnosis not present

## 2013-01-25 MED ORDER — LEVETIRACETAM ER 500 MG PO TB24: 1000.0000 mg | ORAL_TABLET | Freq: Every day | ORAL | Status: DC
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
## 2013-02-05 ENCOUNTER — Other Ambulatory Visit: Payer: Medicare Other

## 2013-02-07 ENCOUNTER — Ambulatory Visit (INDEPENDENT_AMBULATORY_CARE_PROVIDER_SITE_OTHER): Payer: Medicare Other | Admitting: Physician Assistant

## 2013-02-07 ENCOUNTER — Encounter: Payer: Self-pay | Admitting: Physician Assistant

## 2013-02-07 VITALS — BP 118/78 | HR 68 | Ht 74.0 in | Wt 250.5 lb

## 2013-02-07 DIAGNOSIS — R569 Unspecified convulsions: Secondary | ICD-10-CM | POA: Diagnosis not present

## 2013-02-07 DIAGNOSIS — I2510 Atherosclerotic heart disease of native coronary artery without angina pectoris: Secondary | ICD-10-CM | POA: Diagnosis not present

## 2013-02-07 NOTE — Progress Notes (Signed)
Primary Cardiologist: Joshua Bonito, MD   HPI: Status post elective cardiac catheterization, arranged by Dr. Myrtis Ser at time of recent OV, for ongoing evaluation of persistent CP, following suboptimal NL GXT Cardiolite in April.   - Cardiac catheterization, May 27: Diffuse coronary ectasia with nonobstructive stenosis primarily in the LAD and RCA; NL LVF (55-65%)  The results of the recent cardiac catheterization were reviewed in full. He continues to experience intermittent CP, while at rest, and denies any history of exertional CP.  Of note, patient is currently undergoing neurologic evaluation, and has been recently diagnosed with possible mild seizures.  Allergies  Allergen Reactions  . Aspirin     Cannot take if not coated, makes stomach burn  . Codeine     REACTION: UNKNOWN REACTION  . Sulfa Antibiotics     Not sure of reaction    Current Outpatient Prescriptions  Medication Sig Dispense Refill  . aspirin EC 81 MG tablet Take 81 mg by mouth daily.        . Calcium Carbonate-Vit D-Min (CALCIUM 1200 PO) Take by mouth daily.      . Cholecalciferol (VITAMIN D3) 1000 UNITS CAPS Take by mouth.        . COD LIVER OIL PO Take 5 mLs by mouth daily.       . fish oil-omega-3 fatty acids 1000 MG capsule Take 2 g by mouth daily.      . Garlic 1000 MG CAPS Take 1,000 mg by mouth daily.        . Methylsulfonylmethane (MSM) 1500 MG TABS Take 1,500 mg by mouth daily.        . Multiple Vitamin (MULTIVITAMIN) capsule Take 1 capsule by mouth daily.        Marland Kitchen omeprazole (PRILOSEC) 20 MG capsule Take 20 mg by mouth 2 (two) times daily.       . simvastatin (ZOCOR) 40 MG tablet Take 40 mg by mouth at bedtime.       Marland Kitchen zinc gluconate 50 MG tablet Take 50 mg by mouth daily.         No current facility-administered medications for this visit.    Past Medical History  Diagnosis Date  . Hypercholesterolemia   . 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 Barrett's esophagus   . S/P endoscopy August 2007   Dr. Darrick Penna: chronic active gastritis, +H.pylori, ? treatment?, short-segment Barrett's  . S/P colonoscopy     ? awaiting records?  . Sleep apnea     yes c pap  . Cancer     basal ceel removed ,  one on head and one on right arm  . Blood transfusion     as child  . GERD (gastroesophageal reflux disease)     takes  prilosec  . Arthritis     hands  . Anxiety     yes here lat[place]  , no meds  . Diabetes mellitus     ,  poor circulation  . Pneumonia     3 times as baby  . Hiatal hernia   . CAD (coronary artery disease)     Nonobstructive coronary disease by cath, 2007  . Pre-syncope     May, 2015  . Ejection fraction     EF 60%, catheterization, 2007  //   EF 60%, echo, April, 2014 while in hospital  . Sinus bradycardia     Asymptomatic in 2007  . Aortic root dilatation     Mild,  echo, April, 2014  . SVT (supraventricular tachycardia)     5 beats atrial tachycardia 48 hour Holter, May, 2014, no atrial fibrillation, no atrial flutter.  . Memory change     April, 2014, patient is scheduled for neurologic testing Jan 25, 2013    Past Surgical History  Procedure Laterality Date  . Neck surgery    . External ear surgery    . Hand surgery    . Vasectomy    . Anal fissure repair    . Skin cancer removal    . Tonsillectomy      30's  . Esophagogastroduodenoscopy  06/29/11    barrett's esophagus/hiatal hernia/mild gastristis, s/p Savary dilation, path with no sprue, mild chronic gastritis noted, negative H.pylori, intestinal metaplasia consistent with barrett's, no dysplasia  . Colonoscopy  06/29/11    internal hemorrhoids/severe diverticulosis, path negative for microscopic colitis repeat Oct 2017 with 2 days of clear liquids and Moviprep    History   Social History  . Marital Status: Married    Spouse Name: yvonne    Number of Children: N/A  . Years of Education: GED   Occupational History  . RETIRED    Social History Main Topics  . Smoking status: Former Smoker     Types: Cigarettes  . Smokeless tobacco: Never Used     Comment: smoked age [age]  . Alcohol Use: No     Comment: Quit 67 years old  . Drug Use: No  . Sexually Active: Not Currently   Other Topics Concern  . Not on file   Social History Narrative   Patient lives at home with his wife Myrene Buddy). Patient is retired. Patient has his GED. Caffeine two cups daily. Right handed.    Family History  Problem Relation Age of Onset  . Colon cancer Neg Hx   . High blood pressure Father   . Diabetes type II Father     ROS: no nausea, vomiting; no fever, chills; no melena, hematochezia; no claudication  PHYSICAL EXAM: BP 118/78  Pulse 68  Ht 6\' 2"  (1.88 m)  Wt 250 lb 8 oz (113.626 kg)  BMI 32.15 kg/m2 GENERAL: 67 year old male; NAD HEENT: NCAT, PERRLA, EOMI; sclera clear; no xanthelasma NECK: palpable bilateral carotid pulses, no bruits; no JVD; no TM LUNGS: CTA bilaterally CARDIAC: RRR (S1, S2); no significant murmurs; no rubs or gallops ABDOMEN: soft, non-tender; intact BS EXTREMETIES: Palpable R RP, no hematoma; no significant peripheral edema SKIN: warm/dry; no obvious rash/lesions MUSCULOSKELETAL: no joint deformity NEURO: no focal deficit; NL affect   EKG:    ASSESSMENT & PLAN:  CAD (coronary artery disease) Although he continues to have intermittent CP, essentially unabated since recent OV, he denies any strict correlation with exertion. Given the results of his recent cardiac catheterization, I reassured him that his symptoms are clearly noncardiac in origin. Therefore, I recommended continued treatment with low-dose ASA and statin therapy, and we will arrange for him to return to Dr. Myrtis Ser in one year.  Seizures Patient instructed to followup with Neurology in [place], as previously scheduled.    Gene Kijana Cromie, PAC

## 2013-02-07 NOTE — Patient Instructions (Addendum)
Your physician wants you to follow-up in: 1 year with Dr. Myrtis Ser.  You will receive a reminder letter in the mail two months in advance. If you don't receive a letter, please call our office to schedule the follow-up appointment.

## 2013-02-07 NOTE — Assessment & Plan Note (Signed)
Patient instructed to followup with Neurology in [facility], as previously scheduled.

## 2013-02-07 NOTE — Assessment & Plan Note (Signed)
Although he continues to have intermittent CP, essentially unabated since recent OV, he denies any strict correlation with exertion. Given the results of his recent cardiac catheterization, I reassured him that his symptoms are clearly noncardiac in origin. Therefore, I recommended continued treatment with low-dose ASA and statin therapy, and we will arrange for him to return to Dr. Myrtis Ser in one year.

## 2013-02-08 DIAGNOSIS — H6690 Otitis media, unspecified, unspecified ear: Secondary | ICD-10-CM | POA: Diagnosis not present

## 2013-02-08 DIAGNOSIS — E119 Type 2 diabetes mellitus without complications: Secondary | ICD-10-CM | POA: Diagnosis not present

## 2013-02-08 DIAGNOSIS — R413 Other amnesia: Secondary | ICD-10-CM | POA: Diagnosis not present

## 2013-02-08 DIAGNOSIS — R51 Headache: Secondary | ICD-10-CM | POA: Diagnosis not present

## 2013-02-11 ENCOUNTER — Ambulatory Visit
Admission: RE | Admit: 2013-02-11 | Discharge: 2013-02-11 | Disposition: A | Discharge status: Home / Self Care | Payer: Medicare Other | Source: Ambulatory Visit | Attending: Neurology | Admitting: Neurology

## 2013-02-11 DIAGNOSIS — I658 Occlusion and stenosis of other precerebral arteries: Secondary | ICD-10-CM | POA: Diagnosis not present

## 2013-02-11 DIAGNOSIS — R569 Unspecified convulsions: Secondary | ICD-10-CM | POA: Diagnosis not present

## 2013-02-11 DIAGNOSIS — E119 Type 2 diabetes mellitus without complications: Secondary | ICD-10-CM

## 2013-02-11 MED ORDER — GADOBENATE DIMEGLUMINE 529 MG/ML IV SOLN
20.0000 mL | Freq: Once | INTRAVENOUS | Status: AC | PRN
  Administered 2013-02-11: 20 mL via INTRAVENOUS

## 2013-02-12 ENCOUNTER — Ambulatory Visit (INDEPENDENT_AMBULATORY_CARE_PROVIDER_SITE_OTHER): Payer: Medicare Other

## 2013-02-12 DIAGNOSIS — R569 Unspecified convulsions: Secondary | ICD-10-CM

## 2013-02-12 DIAGNOSIS — E119 Type 2 diabetes mellitus without complications: Secondary | ICD-10-CM

## 2013-02-12 NOTE — Progress Notes (Signed)
Quick Note:  Please call patient, no significant large vessel disease on [place] cartoid ______

## 2013-02-12 NOTE — Progress Notes (Signed)
Quick Note:  Please call patient, mild Abnormal MRI brain (with and without) demonstrating: 1. Mild scattered periventricular and subcortical foci of chronic small vessel ischemic disease. No abnormal enhancing lesions.  No change from MRI on 11/04/08. ______

## 2013-02-14 NOTE — Progress Notes (Signed)
Quick Note:  Left message and relayed MRI results, per Dr. Terrace Arabia. Told to call office with any further questions. ______

## 2013-02-14 NOTE — Progress Notes (Signed)
Quick Note:  Left message and relayed carotid doppler results, per Dr. Terrace Arabia. Told to call with any questions. ______

## 2013-02-19 DIAGNOSIS — E119 Type 2 diabetes mellitus without complications: Secondary | ICD-10-CM | POA: Diagnosis not present

## 2013-02-19 DIAGNOSIS — R350 Frequency of micturition: Secondary | ICD-10-CM | POA: Diagnosis not present

## 2013-02-19 DIAGNOSIS — R569 Unspecified convulsions: Secondary | ICD-10-CM | POA: Diagnosis not present

## 2013-02-19 DIAGNOSIS — R413 Other amnesia: Secondary | ICD-10-CM | POA: Diagnosis not present

## 2013-02-19 DIAGNOSIS — R51 Headache: Secondary | ICD-10-CM | POA: Diagnosis not present

## 2013-02-20 NOTE — Procedures (Signed)
HISTORY: 67 years old gentleman, with episode of confusion, rule out seizure  TECHNIQUE:  16 channel EEG was performed based on standard 10-16 international system. One channel was dedicated to EKG, which has demonstrates normal sinus rhythm of 60 beats per minutes.  Upon awakening, the posterior background activity was dysarrythmic in the theta range, with amplitude of 12 microvoltage, reactive to eye opening and closure. There was also short protrusion of higher amplitude frontally dominant delta range activity,  There was no evidence of epileptiform discharge.  Photic stimulation was performed, which induced a symmetric photic driving.  Hyperventilation was performed, there was no abnormality elicit.  No sleep was achieved.  CONCLUSION: This is an abnormal EEG.  There is  electrodiagnostic evidence of bihemisphere slowing, but there was no evidence of epileptiform discharges, the findings are commonly seen in metabolic toxic disorder, or central nervous system degenerative disorder

## 2013-03-06 ENCOUNTER — Ambulatory Visit (INDEPENDENT_AMBULATORY_CARE_PROVIDER_SITE_OTHER): Payer: Medicare Other | Admitting: Neurology

## 2013-03-06 ENCOUNTER — Telehealth: Payer: Self-pay | Admitting: Neurology

## 2013-03-06 ENCOUNTER — Encounter: Payer: Self-pay | Admitting: Neurology

## 2013-03-06 VITALS — BP 118/71 | HR 55 | Wt 249.0 lb

## 2013-03-06 DIAGNOSIS — E785 Hyperlipidemia, unspecified: Secondary | ICD-10-CM

## 2013-03-06 DIAGNOSIS — E119 Type 2 diabetes mellitus without complications: Secondary | ICD-10-CM

## 2013-03-06 DIAGNOSIS — R569 Unspecified convulsions: Secondary | ICD-10-CM

## 2013-03-06 MED ORDER — DONEPEZIL HCL 10 MG PO TABS: 10.0000 mg | ORAL_TABLET | Freq: Every day | ORAL | Status: DC

## 2013-03-06 NOTE — Progress Notes (Signed)
History of present illness:  Joshua Khan is a 67 years old right-handed Caucasian male, was referred by his primary care physician Dr. 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  He was with his wife, and son at today's clinical visit, who reported episodes of transient confusion, over past 1 month   His family described few episode, he workup one day confuse, staring into space, slurred speech, lasting for a few minutes, he has no collection of the event  There was one episode,while he was driving, he suddenly felt weird, confused, he wants to change seat with his son, then he was noticed staring into space, lasting for a few minutes, he had pressure sensation across his chest, traveling down to his scrotum,  He had multiple episodes over the past one month, there was no generalized seizure activity noticed, he was put into hospital in Jan 15 2013 for recurrent episodes   Coronary artery angiogram showed diffuse coronary ectasia with nonobstructive stenosis primarily in the LAD and right coronary artery. Normal left ventricular systolic function   Over past 2 months, he also complains of frequent bifrontal headaches, his wife also noticed gradual onset slurred worsening memory trouble for more than one year, he had  7 years of education, retired at age 89, since retirement, he spent 7 years,was able to get his GED diploma   UPDATE July 9th 2014: He has no more confusion episodes, he still has trouble with memory. He also complains of hard of hearing. MRI brain showed mild atrophy, small vessel disease.  US carotid, no large vessel disease.  EEG showed diffuse slowing, there was no epileptiform discharge He could not tolerate Keppra, he  became very moody, he drove back for followup  Review of Systems  Out of a complete 14 system review, the patient complains of only the following symptoms, and all other reviewed systems are negative.   Constitutional:   N/A Cardiovascular:  N/A Ear/Nose/Throat:  N/A Skin: N/A Eyes: N/A Respiratory: N/A Gastroitestinal: N/A    Hematology/Lymphatic:  N/A Endocrine:  N/A Musculoskeletal:N/A Allergy/Immunology: N/A Neurological: memory loss, hard of hearing Psychiatric:    N/A  PHYSICAL EXAMINATOINS:  Generalized: In no acute distress  Neck: Supple, no carotid bruits   Cardiac: Regular rate rhythm  Pulmonary: Clear to auscultation bilaterally  Musculoskeletal: No deformity  Neurological examination  Mentation: Alert oriented to time, place, history taking, and causual conversation, MMSE 25/30, He missed 3/3 recall, not oriented to day.  Cranial nerve II-XII: Pupils were equal round reactive to light extraocular movements were full, visual field were full on confrontational test. facial sensation and strength were normal. Mild hard of hearing bilaterally. Uvula tongue midline.  head turning and shoulder shrug and were normal and symmetric.Tongue protrusion into cheek strength was normal.  Motor: normal tone, bulk and strength.  Sensory: length dependent decreased fine touch, pinprick to midshin, absent vibratory sensation to knee level, and preserved proprioception at toes.  Coordination: Normal finger to nose, heel-to-shin bilaterally   Gait: Rising up from seated position without assistance, normal stance, mild difficulty with tiptoe and heel, could not perform tandem.  Romberg signs: Negative  Deep tendon reflexes: Brachioradialis 2/2, biceps 2/2, triceps 2/2, patellar 2/2, Achilles trace, plantar responses were flexor bilaterally.  Assessment  and plan: 67 years old right-handed Caucasian male, with past medical history of diabetes, hypertension, coronary artery  disease, now presenting with worsening memory trouble, also 1 month history of frequent spells, EEG showed diffuse slowing, MRI of the brain showed atrophy, mild small vessel disease.  1. 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 2  start Aricept 10 mg every day.  3. He is advised to call my office for recurrent episode, will restart antipeptic medications then 4 return to clinic in 6 months with Joshua Khan.

## 2013-03-07 MED ORDER — DONEPEZIL HCL 10 MG PO TABS: 10.0000 mg | ORAL_TABLET | Freq: Every day | ORAL | Status: DC

## 2013-03-07 NOTE — Telephone Encounter (Signed)
OV notes say the patient is to take Aricept 10mg  every day.  I have resent the Rx.

## 2013-03-11 DIAGNOSIS — IMO0002 Reserved for concepts with insufficient information to code with codable children: Secondary | ICD-10-CM | POA: Diagnosis not present

## 2013-04-04 DIAGNOSIS — R569 Unspecified convulsions: Secondary | ICD-10-CM | POA: Diagnosis not present

## 2013-04-11 DIAGNOSIS — E119 Type 2 diabetes mellitus without complications: Secondary | ICD-10-CM | POA: Diagnosis not present

## 2013-04-11 DIAGNOSIS — R252 Cramp and spasm: Secondary | ICD-10-CM | POA: Diagnosis not present

## 2013-04-11 DIAGNOSIS — R569 Unspecified convulsions: Secondary | ICD-10-CM | POA: Diagnosis not present

## 2013-04-11 DIAGNOSIS — R413 Other amnesia: Secondary | ICD-10-CM | POA: Diagnosis not present

## 2013-04-11 DIAGNOSIS — R51 Headache: Secondary | ICD-10-CM | POA: Diagnosis not present

## 2013-04-11 DIAGNOSIS — M79609 Pain in unspecified limb: Secondary | ICD-10-CM | POA: Diagnosis not present

## 2013-05-14 DIAGNOSIS — Z23 Encounter for immunization: Secondary | ICD-10-CM | POA: Diagnosis not present

## 2013-05-14 DIAGNOSIS — M5380 Other specified dorsopathies, site unspecified: Secondary | ICD-10-CM | POA: Diagnosis not present

## 2013-05-14 DIAGNOSIS — R413 Other amnesia: Secondary | ICD-10-CM | POA: Diagnosis not present

## 2013-05-14 DIAGNOSIS — R197 Diarrhea, unspecified: Secondary | ICD-10-CM | POA: Diagnosis not present

## 2013-05-14 DIAGNOSIS — E119 Type 2 diabetes mellitus without complications: Secondary | ICD-10-CM | POA: Diagnosis not present

## 2013-05-14 DIAGNOSIS — M549 Dorsalgia, unspecified: Secondary | ICD-10-CM | POA: Diagnosis not present

## 2013-05-20 DIAGNOSIS — H02829 Cysts of unspecified eye, unspecified eyelid: Secondary | ICD-10-CM | POA: Diagnosis not present

## 2013-05-20 DIAGNOSIS — H11159 Pinguecula, unspecified eye: Secondary | ICD-10-CM | POA: Diagnosis not present

## 2013-05-20 DIAGNOSIS — E119 Type 2 diabetes mellitus without complications: Secondary | ICD-10-CM | POA: Diagnosis not present

## 2013-06-04 DIAGNOSIS — R55 Syncope and collapse: Secondary | ICD-10-CM | POA: Diagnosis not present

## 2013-06-04 DIAGNOSIS — R413 Other amnesia: Secondary | ICD-10-CM | POA: Diagnosis not present

## 2013-06-04 DIAGNOSIS — E119 Type 2 diabetes mellitus without complications: Secondary | ICD-10-CM | POA: Diagnosis not present

## 2013-06-04 DIAGNOSIS — G4733 Obstructive sleep apnea (adult) (pediatric): Secondary | ICD-10-CM | POA: Diagnosis not present

## 2013-06-06 DIAGNOSIS — R55 Syncope and collapse: Secondary | ICD-10-CM | POA: Diagnosis not present

## 2013-06-06 DIAGNOSIS — E119 Type 2 diabetes mellitus without complications: Secondary | ICD-10-CM | POA: Diagnosis not present

## 2013-06-06 DIAGNOSIS — G4733 Obstructive sleep apnea (adult) (pediatric): Secondary | ICD-10-CM | POA: Diagnosis not present

## 2013-06-06 DIAGNOSIS — R413 Other amnesia: Secondary | ICD-10-CM | POA: Diagnosis not present

## 2013-07-10 DIAGNOSIS — H6690 Otitis media, unspecified, unspecified ear: Secondary | ICD-10-CM | POA: Diagnosis not present

## 2013-07-10 DIAGNOSIS — M542 Cervicalgia: Secondary | ICD-10-CM | POA: Diagnosis not present

## 2013-07-10 DIAGNOSIS — R413 Other amnesia: Secondary | ICD-10-CM | POA: Diagnosis not present

## 2013-07-10 DIAGNOSIS — M159 Polyosteoarthritis, unspecified: Secondary | ICD-10-CM | POA: Diagnosis not present

## 2013-08-05 ENCOUNTER — Encounter: Payer: Self-pay | Admitting: Neurology

## 2013-08-07 DIAGNOSIS — G4733 Obstructive sleep apnea (adult) (pediatric): Secondary | ICD-10-CM | POA: Diagnosis not present

## 2013-08-07 DIAGNOSIS — E119 Type 2 diabetes mellitus without complications: Secondary | ICD-10-CM | POA: Diagnosis not present

## 2013-08-07 DIAGNOSIS — E7800 Pure hypercholesterolemia, unspecified: Secondary | ICD-10-CM | POA: Diagnosis not present

## 2013-08-07 DIAGNOSIS — K219 Gastro-esophageal reflux disease without esophagitis: Secondary | ICD-10-CM | POA: Diagnosis not present

## 2013-08-14 DIAGNOSIS — Z0000 Encounter for general adult medical examination without abnormal findings: Secondary | ICD-10-CM | POA: Diagnosis not present

## 2013-08-14 DIAGNOSIS — H6690 Otitis media, unspecified, unspecified ear: Secondary | ICD-10-CM | POA: Diagnosis not present

## 2013-08-14 DIAGNOSIS — R51 Headache: Secondary | ICD-10-CM | POA: Diagnosis not present

## 2013-08-14 DIAGNOSIS — Z1331 Encounter for screening for depression: Secondary | ICD-10-CM | POA: Diagnosis not present

## 2013-08-14 DIAGNOSIS — R300 Dysuria: Secondary | ICD-10-CM | POA: Diagnosis not present

## 2013-08-19 DIAGNOSIS — R5381 Other malaise: Secondary | ICD-10-CM | POA: Diagnosis not present

## 2013-08-19 DIAGNOSIS — R6882 Decreased libido: Secondary | ICD-10-CM | POA: Diagnosis not present

## 2013-08-20 DIAGNOSIS — R6882 Decreased libido: Secondary | ICD-10-CM | POA: Diagnosis not present

## 2013-08-26 DIAGNOSIS — H6980 Other specified disorders of Eustachian tube, unspecified ear: Secondary | ICD-10-CM | POA: Diagnosis not present

## 2013-08-27 ENCOUNTER — Ambulatory Visit: Payer: Medicare Other | Admitting: Neurology

## 2013-09-03 DIAGNOSIS — H6980 Other specified disorders of Eustachian tube, unspecified ear: Secondary | ICD-10-CM | POA: Diagnosis not present

## 2013-09-03 DIAGNOSIS — H93299 Other abnormal auditory perceptions, unspecified ear: Secondary | ICD-10-CM | POA: Diagnosis not present

## 2013-09-03 DIAGNOSIS — H906 Mixed conductive and sensorineural hearing loss, bilateral: Secondary | ICD-10-CM | POA: Diagnosis not present

## 2013-09-06 ENCOUNTER — Ambulatory Visit: Payer: Medicare Other | Admitting: Neurology

## 2013-09-11 DIAGNOSIS — G4733 Obstructive sleep apnea (adult) (pediatric): Secondary | ICD-10-CM | POA: Diagnosis not present

## 2013-09-11 DIAGNOSIS — R413 Other amnesia: Secondary | ICD-10-CM | POA: Diagnosis not present

## 2013-09-11 DIAGNOSIS — E119 Type 2 diabetes mellitus without complications: Secondary | ICD-10-CM | POA: Diagnosis not present

## 2013-09-11 DIAGNOSIS — R55 Syncope and collapse: Secondary | ICD-10-CM | POA: Diagnosis not present

## 2013-09-12 DIAGNOSIS — R51 Headache: Secondary | ICD-10-CM | POA: Diagnosis not present

## 2013-09-12 DIAGNOSIS — R6882 Decreased libido: Secondary | ICD-10-CM | POA: Diagnosis not present

## 2013-09-12 DIAGNOSIS — IMO0001 Reserved for inherently not codable concepts without codable children: Secondary | ICD-10-CM | POA: Diagnosis not present

## 2013-09-12 DIAGNOSIS — E299 Testicular dysfunction, unspecified: Secondary | ICD-10-CM | POA: Diagnosis not present

## 2013-09-20 DIAGNOSIS — E299 Testicular dysfunction, unspecified: Secondary | ICD-10-CM | POA: Diagnosis not present

## 2013-09-26 DIAGNOSIS — H6980 Other specified disorders of Eustachian tube, unspecified ear: Secondary | ICD-10-CM | POA: Diagnosis not present

## 2013-09-26 DIAGNOSIS — H906 Mixed conductive and sensorineural hearing loss, bilateral: Secondary | ICD-10-CM | POA: Diagnosis not present

## 2013-09-26 DIAGNOSIS — H93299 Other abnormal auditory perceptions, unspecified ear: Secondary | ICD-10-CM | POA: Diagnosis not present

## 2013-09-26 DIAGNOSIS — H6120 Impacted cerumen, unspecified ear: Secondary | ICD-10-CM | POA: Diagnosis not present

## 2013-10-21 DIAGNOSIS — E299 Testicular dysfunction, unspecified: Secondary | ICD-10-CM | POA: Diagnosis not present

## 2013-10-24 DIAGNOSIS — S82899A Other fracture of unspecified lower leg, initial encounter for closed fracture: Secondary | ICD-10-CM | POA: Diagnosis not present

## 2013-10-24 DIAGNOSIS — E785 Hyperlipidemia, unspecified: Secondary | ICD-10-CM | POA: Diagnosis not present

## 2013-10-24 DIAGNOSIS — K219 Gastro-esophageal reflux disease without esophagitis: Secondary | ICD-10-CM | POA: Diagnosis not present

## 2013-10-24 DIAGNOSIS — Z79899 Other long term (current) drug therapy: Secondary | ICD-10-CM | POA: Diagnosis not present

## 2013-10-24 DIAGNOSIS — Z833 Family history of diabetes mellitus: Secondary | ICD-10-CM | POA: Diagnosis not present

## 2013-10-24 DIAGNOSIS — E119 Type 2 diabetes mellitus without complications: Secondary | ICD-10-CM | POA: Diagnosis not present

## 2013-10-24 DIAGNOSIS — Z87891 Personal history of nicotine dependence: Secondary | ICD-10-CM | POA: Diagnosis not present

## 2013-10-24 DIAGNOSIS — Z7982 Long term (current) use of aspirin: Secondary | ICD-10-CM | POA: Diagnosis not present

## 2013-10-24 DIAGNOSIS — W010XXA Fall on same level from slipping, tripping and stumbling without subsequent striking against object, initial encounter: Secondary | ICD-10-CM | POA: Diagnosis not present

## 2013-10-29 DIAGNOSIS — S82843A Displaced bimalleolar fracture of unspecified lower leg, initial encounter for closed fracture: Secondary | ICD-10-CM | POA: Diagnosis not present

## 2013-11-18 DIAGNOSIS — E299 Testicular dysfunction, unspecified: Secondary | ICD-10-CM | POA: Diagnosis not present

## 2013-11-19 DIAGNOSIS — R51 Headache: Secondary | ICD-10-CM | POA: Diagnosis not present

## 2013-11-19 DIAGNOSIS — M542 Cervicalgia: Secondary | ICD-10-CM | POA: Diagnosis not present

## 2013-11-19 DIAGNOSIS — M5380 Other specified dorsopathies, site unspecified: Secondary | ICD-10-CM | POA: Diagnosis not present

## 2013-12-09 DIAGNOSIS — S82843A Displaced bimalleolar fracture of unspecified lower leg, initial encounter for closed fracture: Secondary | ICD-10-CM | POA: Diagnosis not present

## 2013-12-23 DIAGNOSIS — E299 Testicular dysfunction, unspecified: Secondary | ICD-10-CM | POA: Diagnosis not present

## 2013-12-23 DIAGNOSIS — IMO0001 Reserved for inherently not codable concepts without codable children: Secondary | ICD-10-CM | POA: Diagnosis not present

## 2013-12-24 DIAGNOSIS — R6882 Decreased libido: Secondary | ICD-10-CM | POA: Diagnosis not present

## 2013-12-24 DIAGNOSIS — R5381 Other malaise: Secondary | ICD-10-CM | POA: Diagnosis not present

## 2013-12-24 DIAGNOSIS — R5383 Other fatigue: Secondary | ICD-10-CM | POA: Diagnosis not present

## 2013-12-24 DIAGNOSIS — IMO0001 Reserved for inherently not codable concepts without codable children: Secondary | ICD-10-CM | POA: Diagnosis not present

## 2013-12-24 DIAGNOSIS — E299 Testicular dysfunction, unspecified: Secondary | ICD-10-CM | POA: Diagnosis not present

## 2014-01-07 DIAGNOSIS — H93299 Other abnormal auditory perceptions, unspecified ear: Secondary | ICD-10-CM | POA: Diagnosis not present

## 2014-01-07 DIAGNOSIS — R5381 Other malaise: Secondary | ICD-10-CM | POA: Diagnosis not present

## 2014-01-07 DIAGNOSIS — H6120 Impacted cerumen, unspecified ear: Secondary | ICD-10-CM | POA: Diagnosis not present

## 2014-01-07 DIAGNOSIS — H6980 Other specified disorders of Eustachian tube, unspecified ear: Secondary | ICD-10-CM | POA: Diagnosis not present

## 2014-01-07 DIAGNOSIS — R300 Dysuria: Secondary | ICD-10-CM | POA: Diagnosis not present

## 2014-01-07 DIAGNOSIS — R079 Chest pain, unspecified: Secondary | ICD-10-CM | POA: Diagnosis not present

## 2014-01-07 DIAGNOSIS — IMO0001 Reserved for inherently not codable concepts without codable children: Secondary | ICD-10-CM | POA: Diagnosis not present

## 2014-01-07 DIAGNOSIS — I10 Essential (primary) hypertension: Secondary | ICD-10-CM | POA: Diagnosis not present

## 2014-01-07 DIAGNOSIS — E299 Testicular dysfunction, unspecified: Secondary | ICD-10-CM | POA: Diagnosis not present

## 2014-01-07 DIAGNOSIS — H906 Mixed conductive and sensorineural hearing loss, bilateral: Secondary | ICD-10-CM | POA: Diagnosis not present

## 2014-01-07 DIAGNOSIS — R5383 Other fatigue: Secondary | ICD-10-CM | POA: Diagnosis not present

## 2014-01-21 DIAGNOSIS — S82843A Displaced bimalleolar fracture of unspecified lower leg, initial encounter for closed fracture: Secondary | ICD-10-CM | POA: Diagnosis not present

## 2014-02-06 ENCOUNTER — Encounter: Payer: Self-pay | Admitting: Cardiology

## 2014-02-10 ENCOUNTER — Ambulatory Visit (INDEPENDENT_AMBULATORY_CARE_PROVIDER_SITE_OTHER): Payer: Medicare Other | Admitting: Cardiology

## 2014-02-10 ENCOUNTER — Encounter: Payer: Self-pay | Admitting: Cardiology

## 2014-02-10 VITALS — BP 143/78 | HR 49 | Ht 74.0 in | Wt 264.0 lb

## 2014-02-10 DIAGNOSIS — I471 Supraventricular tachycardia: Secondary | ICD-10-CM

## 2014-02-10 DIAGNOSIS — I2510 Atherosclerotic heart disease of native coronary artery without angina pectoris: Secondary | ICD-10-CM | POA: Diagnosis not present

## 2014-02-10 DIAGNOSIS — I498 Other specified cardiac arrhythmias: Secondary | ICD-10-CM

## 2014-02-10 DIAGNOSIS — I77810 Thoracic aortic ectasia: Secondary | ICD-10-CM

## 2014-02-10 DIAGNOSIS — R55 Syncope and collapse: Secondary | ICD-10-CM

## 2014-02-10 DIAGNOSIS — E785 Hyperlipidemia, unspecified: Secondary | ICD-10-CM | POA: Diagnosis not present

## 2014-02-10 DIAGNOSIS — R413 Other amnesia: Secondary | ICD-10-CM

## 2014-02-10 DIAGNOSIS — R569 Unspecified convulsions: Secondary | ICD-10-CM

## 2014-02-10 NOTE — Assessment & Plan Note (Signed)
The patient had been seen by neurology in the past. We will recheck his medications. He has not had any obvious seizures.

## 2014-02-10 NOTE — Assessment & Plan Note (Signed)
We know he has mild coronary disease by catheterization in the past. He is stable. No further workup.

## 2014-02-10 NOTE — Assessment & Plan Note (Signed)
The patient has been seen by neurology in the past. He is on some Aricept.

## 2014-02-10 NOTE — Progress Notes (Signed)
Patient ID: Joshua Khan, male   DOB: 03/06/1946, 68 y.o.   MRN: [id number]    HPI  Patient is seen today to followup his cardiac status. I saw him last in May, 2014. He's actually doing well. He has rare chest discomfort that does not sound ischemic in origin. He mentions an unusual symptom that does not sound cardiac. He can have a sensation with burning that starts at the top of his head and then goes down the center of his body. This lasts a brief period of time and disappears. It does not affect him in any other way.  Allergies  Allergen Reactions  . Aspirin     Cannot take if not coated, makes stomach burn  . Codeine     REACTION: UNKNOWN REACTION  . Sulfa Antibiotics     Not sure of reaction    Current Outpatient Prescriptions  Medication Sig Dispense Refill  . aspirin EC 81 MG tablet Take 81 mg by mouth daily.        . Calcium Carbonate-Vit D-Min (CALCIUM 1200 PO) Take by mouth daily.      . Cholecalciferol (VITAMIN D3) 1000 UNITS CAPS Take by mouth.        . COD LIVER OIL PO Take 5 mLs by mouth daily.       Marland Kitchen donepezil (ARICEPT) 10 MG tablet Take 1 tablet (10 mg total) by mouth at bedtime.  30 tablet  12  . fish oil-omega-3 fatty acids 1000 MG capsule Take 2 g by mouth daily.      . Garlic 8502 MG CAPS Take 1,000 mg by mouth daily.        Marland Kitchen levETIRAcetam (KEPPRA XR) 500 MG 24 hr tablet Take 500 mg by mouth daily.      Marland Kitchen lisinopril (PRINIVIL,ZESTRIL) 10 MG tablet Take 10 mg by mouth daily.      . Methylsulfonylmethane (MSM) 1500 MG TABS Take 1,500 mg by mouth daily.        . Multiple Vitamin (MULTIVITAMIN) capsule Take 1 capsule by mouth daily.        Marland Kitchen omeprazole (PRILOSEC) 20 MG capsule Take 20 mg by mouth 2 (two) times daily.       . simvastatin (ZOCOR) 40 MG tablet Take 40 mg by mouth at bedtime.       Marland Kitchen zinc gluconate 50 MG tablet Take 50 mg by mouth daily.         No current facility-administered medications for this visit.    History   Social History  . Marital  Status: Married    Spouse Name: Joshua Khan    Number of Children: N/A  . Years of Education: GED   Occupational History  . RETIRED    Social History Main Topics  . Smoking status: Former Smoker    Types: Cigarettes  . Smokeless tobacco: Never Used     Comment: smoked age [age]  . Alcohol Use: No     Comment: Quit 68 years old  . Drug Use: No  . Sexual Activity: Not Currently   Other Topics Concern  . Not on file   Social History Narrative   Patient lives at home with his wife Joshua Khan). Patient is retired. Patient has his GED. Caffeine two cups daily. Right handed.    Family History  Problem Relation Age of Onset  . Colon cancer Neg Hx   . High blood pressure Father   . Diabetes type II Father     Past Medical  History  Diagnosis Date  . Hypercholesterolemia   . 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 Barrett's esophagus   . S/P endoscopy August 2007    Dr. Oneida Alar: chronic active gastritis, +H.pylori, ? treatment?, short-segment Barrett's  . S/P colonoscopy     ? awaiting records?  . Sleep apnea     yes c pap  . Cancer     basal ceel removed ,  one on head and one on right arm  . Blood transfusion     as child  . GERD (gastroesophageal reflux disease)     takes  prilosec  . Arthritis     hands  . Anxiety     yes here lat[place]  , no meds  . Diabetes mellitus     ,  poor circulation  . Pneumonia     3 times as baby  . Hiatal hernia   . CAD (coronary artery disease)     Nonobstructive coronary disease by cath, 2007  . Pre-syncope     May, 2015  . Ejection fraction     EF 60%, catheterization, 2007  //   EF 60%, echo, April, 2014 while in hospital  . Sinus bradycardia     Asymptomatic in 2007  . Aortic root dilatation     Mild, echo, April, 2014  . SVT (supraventricular tachycardia)     5 beats atrial tachycardia 48 hour Holter, May, 2014, no atrial fibrillation, no atrial flutter.  . Memory change     April, 2014, patient is scheduled for neurologic  testing Jan 25, 2013    Past Surgical History  Procedure Laterality Date  . Neck surgery    . External ear surgery    . Hand surgery    . Vasectomy    . Anal fissure repair    . Skin cancer removal    . Tonsillectomy      30's  . Esophagogastroduodenoscopy  06/29/11    barrett's esophagus/hiatal hernia/mild gastristis, s/p Savary dilation, path with no sprue, mild chronic gastritis noted, negative H.pylori, intestinal metaplasia consistent with barrett's, no dysplasia  . Colonoscopy  06/29/11    internal hemorrhoids/severe diverticulosis, path negative for microscopic colitis repeat Oct 2017 with 2 days of clear liquids and Moviprep    Patient Active Problem List   Diagnosis Date Noted  . Seizures 01/25/2013  . SVT (supraventricular tachycardia)   . Memory change   . CAD (coronary artery disease)   . Pre-syncope   . Sinus bradycardia   . Ejection fraction   . Anxiety   . Aortic root dilatation   . Loose stools 06/21/2011  . 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 DM 05/19/2009  . HYPERLIPIDEMIA 05/19/2009    ROS   Patient denies fever, chills, headache, sweats, rash, change in vision, change in hearing, cough, nausea vomiting, urinary symptoms. All other systems are reviewed and are negative.  PHYSICAL EXAM  Patient is stable. He is overweight. He's here with his wife. Head is atraumatic. [place] and conjunctiva are normal. There is no jugulovenous distention. Lungs are clear. Respiratory effort is nonlabored. Cardiac exam reveals S1 and S2. Abdomen is soft. There is no peripheral edema.  Filed Vitals:   02/10/14 0927  BP: 143/78  Pulse: 49  Height: 6\' 2"  (1.88 m)  Weight: 264 lb (119.75 kg)  SpO2: 94%   EKG is done today and reviewed by me. There is normal sinus rhythm. There is no significant abnormality.  ASSESSMENT &  PLAN

## 2014-02-10 NOTE — Assessment & Plan Note (Signed)
The patient has had no syncope or presyncope. No further workup.

## 2014-02-10 NOTE — Assessment & Plan Note (Signed)
He has not had any significant recurring palpitations. No further workup.

## 2014-02-10 NOTE — Assessment & Plan Note (Signed)
There is mild aortic root dilatation in the past. We'll plan for a followup echo in another year

## 2014-02-10 NOTE — Patient Instructions (Signed)

## 2014-02-10 NOTE — Assessment & Plan Note (Signed)
Lipids are being treated by his primary physician. No further workup.

## 2014-03-14 DIAGNOSIS — H905 Unspecified sensorineural hearing loss: Secondary | ICD-10-CM | POA: Diagnosis not present

## 2014-03-14 DIAGNOSIS — H7290 Unspecified perforation of tympanic membrane, unspecified ear: Secondary | ICD-10-CM | POA: Diagnosis not present

## 2014-03-14 DIAGNOSIS — H903 Sensorineural hearing loss, bilateral: Secondary | ICD-10-CM | POA: Diagnosis not present

## 2014-06-23 DIAGNOSIS — S00411A Abrasion of right ear, initial encounter: Secondary | ICD-10-CM | POA: Diagnosis not present

## 2014-06-23 DIAGNOSIS — S00419A Abrasion of unspecified ear, initial encounter: Secondary | ICD-10-CM | POA: Diagnosis not present

## 2014-06-29 DIAGNOSIS — E119 Type 2 diabetes mellitus without complications: Secondary | ICD-10-CM | POA: Diagnosis not present

## 2014-06-29 DIAGNOSIS — Z79899 Other long term (current) drug therapy: Secondary | ICD-10-CM | POA: Diagnosis not present

## 2014-06-29 DIAGNOSIS — Z7982 Long term (current) use of aspirin: Secondary | ICD-10-CM | POA: Diagnosis not present

## 2014-06-29 DIAGNOSIS — J069 Acute upper respiratory infection, unspecified: Secondary | ICD-10-CM | POA: Diagnosis not present

## 2014-06-29 DIAGNOSIS — K219 Gastro-esophageal reflux disease without esophagitis: Secondary | ICD-10-CM | POA: Diagnosis not present

## 2014-06-29 DIAGNOSIS — R05 Cough: Secondary | ICD-10-CM | POA: Diagnosis not present

## 2014-07-03 DIAGNOSIS — L570 Actinic keratosis: Secondary | ICD-10-CM | POA: Diagnosis not present

## 2014-07-03 DIAGNOSIS — K921 Melena: Secondary | ICD-10-CM | POA: Diagnosis not present

## 2014-07-03 DIAGNOSIS — Z0001 Encounter for general adult medical examination with abnormal findings: Secondary | ICD-10-CM | POA: Diagnosis not present

## 2014-07-03 DIAGNOSIS — E1165 Type 2 diabetes mellitus with hyperglycemia: Secondary | ICD-10-CM | POA: Diagnosis not present

## 2014-07-03 DIAGNOSIS — F0281 Dementia in other diseases classified elsewhere with behavioral disturbance: Secondary | ICD-10-CM | POA: Diagnosis not present

## 2014-07-03 DIAGNOSIS — R05 Cough: Secondary | ICD-10-CM | POA: Diagnosis not present

## 2014-08-04 DIAGNOSIS — F0281 Dementia in other diseases classified elsewhere with behavioral disturbance: Secondary | ICD-10-CM | POA: Diagnosis not present

## 2014-08-04 DIAGNOSIS — R51 Headache: Secondary | ICD-10-CM | POA: Diagnosis not present

## 2014-08-04 DIAGNOSIS — E1165 Type 2 diabetes mellitus with hyperglycemia: Secondary | ICD-10-CM | POA: Diagnosis not present

## 2014-08-04 DIAGNOSIS — G4733 Obstructive sleep apnea (adult) (pediatric): Secondary | ICD-10-CM | POA: Diagnosis not present

## 2014-08-06 DIAGNOSIS — H6122 Impacted cerumen, left ear: Secondary | ICD-10-CM | POA: Diagnosis not present

## 2014-08-06 DIAGNOSIS — S00411A Abrasion of right ear, initial encounter: Secondary | ICD-10-CM | POA: Diagnosis not present

## 2014-08-11 ENCOUNTER — Encounter (HOSPITAL_COMMUNITY): Payer: Self-pay | Admitting: Emergency Medicine

## 2014-08-11 ENCOUNTER — Emergency Department (HOSPITAL_COMMUNITY)
Admission: EM | Admit: 2014-08-11 | Discharge: 2014-08-11 | Disposition: A | Discharge status: Home / Self Care | Payer: Medicare Other | Attending: Emergency Medicine | Admitting: Emergency Medicine

## 2014-08-11 DIAGNOSIS — M1990 Unspecified osteoarthritis, unspecified site: Secondary | ICD-10-CM | POA: Diagnosis not present

## 2014-08-11 DIAGNOSIS — Z87891 Personal history of nicotine dependence: Secondary | ICD-10-CM | POA: Insufficient documentation

## 2014-08-11 DIAGNOSIS — E780 Pure hypercholesterolemia: Secondary | ICD-10-CM | POA: Diagnosis not present

## 2014-08-11 DIAGNOSIS — E119 Type 2 diabetes mellitus without complications: Secondary | ICD-10-CM | POA: Insufficient documentation

## 2014-08-11 DIAGNOSIS — R300 Dysuria: Secondary | ICD-10-CM | POA: Diagnosis not present

## 2014-08-11 DIAGNOSIS — Z85828 Personal history of other malignant neoplasm of skin: Secondary | ICD-10-CM | POA: Insufficient documentation

## 2014-08-11 DIAGNOSIS — K219 Gastro-esophageal reflux disease without esophagitis: Secondary | ICD-10-CM | POA: Diagnosis not present

## 2014-08-11 DIAGNOSIS — F419 Anxiety disorder, unspecified: Secondary | ICD-10-CM | POA: Diagnosis not present

## 2014-08-11 DIAGNOSIS — G4730 Sleep apnea, unspecified: Secondary | ICD-10-CM | POA: Diagnosis not present

## 2014-08-11 DIAGNOSIS — Z9981 Dependence on supplemental oxygen: Secondary | ICD-10-CM | POA: Diagnosis not present

## 2014-08-11 DIAGNOSIS — G8929 Other chronic pain: Secondary | ICD-10-CM | POA: Insufficient documentation

## 2014-08-11 DIAGNOSIS — Z8701 Personal history of pneumonia (recurrent): Secondary | ICD-10-CM | POA: Insufficient documentation

## 2014-08-11 DIAGNOSIS — G4489 Other headache syndrome: Secondary | ICD-10-CM | POA: Insufficient documentation

## 2014-08-11 DIAGNOSIS — H538 Other visual disturbances: Secondary | ICD-10-CM | POA: Diagnosis not present

## 2014-08-11 DIAGNOSIS — I2510 Atherosclerotic heart disease of native coronary artery without angina pectoris: Secondary | ICD-10-CM | POA: Diagnosis not present

## 2014-08-11 DIAGNOSIS — E109 Type 1 diabetes mellitus without complications: Secondary | ICD-10-CM | POA: Diagnosis not present

## 2014-08-11 DIAGNOSIS — Z79899 Other long term (current) drug therapy: Secondary | ICD-10-CM | POA: Diagnosis not present

## 2014-08-11 DIAGNOSIS — R51 Headache: Secondary | ICD-10-CM | POA: Diagnosis present

## 2014-08-11 HISTORY — DX: Dorsalgia, unspecified: M54.9

## 2014-08-11 HISTORY — DX: Other chronic pain: G89.29

## 2014-08-11 HISTORY — DX: Radiculopathy, lumbar region: M54.16

## 2014-08-11 HISTORY — DX: Cervicalgia: M54.2

## 2014-08-11 HISTORY — DX: Radiculopathy, cervical region: M54.12

## 2014-08-11 LAB — CBC WITH DIFFERENTIAL/PLATELET
BASOS PCT: 0 % (ref 0–1)
Basophils Absolute: 0 10*3/uL (ref 0.0–0.1)
Eosinophils Absolute: 0.1 10*3/uL (ref 0.0–0.7)
Eosinophils Relative: 1 % (ref 0–5)
HCT: 43 % (ref 39.0–52.0)
HEMOGLOBIN: 14.8 g/dL (ref 13.0–17.0)
LYMPHS ABS: 2.6 10*3/uL (ref 0.7–4.0)
Lymphocytes Relative: 29 % (ref 12–46)
MCH: 31.6 pg (ref 26.0–34.0)
MCHC: 34.4 g/dL (ref 30.0–36.0)
MCV: 91.7 fL (ref 78.0–100.0)
MONOS PCT: 8 % (ref 3–12)
Monocytes Absolute: 0.7 10*3/uL (ref 0.1–1.0)
NEUTROS ABS: 5.5 10*3/uL (ref 1.7–7.7)
Neutrophils Relative %: 62 % (ref 43–77)
Platelets: 202 10*3/uL (ref 150–400)
RBC: 4.69 MIL/uL (ref 4.22–5.81)
RDW: 12.8 % (ref 11.5–15.5)
WBC: 8.9 10*3/uL (ref 4.0–10.5)

## 2014-08-11 LAB — URINALYSIS, ROUTINE W REFLEX MICROSCOPIC
Bilirubin Urine: NEGATIVE
Glucose, UA: 100 mg/dL — AB
HGB URINE DIPSTICK: NEGATIVE
Ketones, ur: NEGATIVE mg/dL
Leukocytes, UA: NEGATIVE
Nitrite: NEGATIVE
Protein, ur: NEGATIVE mg/dL
Urobilinogen, UA: 0.2 mg/dL (ref 0.0–1.0)
pH: 5.5 (ref 5.0–8.0)

## 2014-08-11 LAB — BASIC METABOLIC PANEL
Anion gap: 13 (ref 5–15)
BUN: 7 mg/dL (ref 6–23)
CO2: 26 mEq/L (ref 19–32)
Calcium: 9.6 mg/dL (ref 8.4–10.5)
Chloride: 97 mEq/L (ref 96–112)
Creatinine, Ser: 0.76 mg/dL (ref 0.50–1.35)
Glucose, Bld: 125 mg/dL — ABNORMAL HIGH (ref 70–99)
POTASSIUM: 3.9 meq/L (ref 3.7–5.3)
SODIUM: 136 meq/L — AB (ref 137–147)

## 2014-08-11 NOTE — ED Notes (Signed)
Pt reports having headache & pain down to his penis. Pt states this has been on & off for a while. Pain has occurred 9 times today. In the past just on occasion would happen.

## 2014-08-11 NOTE — ED Provider Notes (Signed)
CSN: [id number]     Arrival date & time 08/11/14  1625 History   First MD Initiated Contact with Patient 08/11/14 2011     Chief Complaint  Patient presents with  . Headache    Patient is a 67 y.o. male presenting with headaches. The history is provided by the patient and the spouse.  Headache Pain location:  Frontal Quality: burning. Radiates to: lower abdomen. Severity currently:  0/10 Onset quality:  Sudden Duration: 5 minutes. Progression:  Resolved Chronicity:  Recurrent Relieved by:  None tried Worsened by:  Nothing tried Associated symptoms: no blurred vision, no fever, no focal weakness, no seizures, no vomiting and no weakness   Patient reports for past year he will have episodes of burning that started in forehead and radiate down to abdomen and feel as if he needs to urinate They last approximately 5 minutes  Today he had 9 episodes which is unusual for him and the most that he has had in awhile Wife reports he appears in pain and it is concerning to watch during the episode  No focal weakness No seizure No falls/injury No syncope He reports he can urinate without issue  He reports extensive workup in past and specialist referral but no clear cause identified.     Past Medical History  Diagnosis Date  . Hypercholesterolemia   . 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 Barrett's esophagus   . S/P endoscopy August 2007    Dr. Oneida Alar: chronic active gastritis, +H.pylori, ? treatment?, short-segment Barrett's  . S/P colonoscopy     ? awaiting records?  . Sleep apnea     yes c pap  . Cancer     basal ceel removed ,  one on head and one on right arm  . Blood transfusion     as child  . GERD (gastroesophageal reflux disease)     takes  prilosec  . Arthritis     hands  . Anxiety     yes here [place]  , no meds  . Diabetes mellitus     ,  poor circulation  . Pneumonia     3 times as baby  . Hiatal hernia   . CAD (coronary artery disease)    Nonobstructive coronary disease by cath, 2007  . Pre-syncope     May, 2015  . Ejection fraction     EF 60%, catheterization, 2007  //   EF 60%, echo, April, 2014 while in hospital  . Sinus bradycardia     Asymptomatic in 2007  . Aortic root dilatation     Mild, echo, April, 2014  . SVT (supraventricular tachycardia)     5 beats atrial tachycardia 48 hour Holter, May, 2014, no atrial fibrillation, no atrial flutter.  . Memory change     April, 2014, patient is scheduled for neurologic testing Jan 25, 2013  . Chronic back pain   . Lumbar radiculopathy   . Chronic neck pain   . Cervical radiculopathy    Past Surgical History  Procedure Laterality Date  . Neck surgery    . External ear surgery    . Hand surgery    . Vasectomy    . Anal fissure repair    . Skin cancer removal    . Tonsillectomy      30's  . Esophagogastroduodenoscopy  06/29/11    barrett's esophagus/hiatal hernia/mild gastristis, s/p Savary dilation, path with no sprue, mild chronic gastritis noted, negative H.pylori,  intestinal metaplasia consistent with barrett's, no dysplasia  . Colonoscopy  06/29/11    internal hemorrhoids/severe diverticulosis, path negative for microscopic colitis repeat Oct 2017 with 2 days of clear liquids and Moviprep   Family History  Problem Relation Age of Onset  . Colon cancer Neg Hx   . High blood pressure Father   . Diabetes type II Father    History  Substance Use Topics  . Smoking status: Former Smoker    Types: Cigarettes  . Smokeless tobacco: Never Used     Comment: smoked age [age]  . Alcohol Use: No     Comment: Quit 68 years old    Review of Systems  Constitutional: Negative for fever.  Eyes: Negative for blurred vision.  Respiratory: Negative for shortness of breath.   Gastrointestinal: Negative for vomiting.  Genitourinary: Positive for dysuria.  Neurological: Positive for headaches. Negative for focal weakness, seizures and weakness.  All other systems  reviewed and are negative.     Allergies  Aspirin; Codeine; and Sulfa antibiotics  Home Medications   Prior to Admission medications   Medication Sig Start Date End Date Taking? Authorizing Provider  calcium carbonate (OS-CAL) 600 MG TABS tablet Take 600 mg by mouth daily.   Yes Historical Provider, MD  COD LIVER OIL PO Take 5 mLs by mouth daily.    Yes Historical Provider, MD  fish oil-omega-3 fatty acids 1000 MG capsule Take 2 g by mouth daily.   Yes Historical Provider, MD  Garlic 3254 MG CAPS Take 1,000 mg by mouth daily.     Yes Historical Provider, MD  memantine (NAMENDA) 5 MG tablet Take 5 mg by mouth daily.   Yes Historical Provider, MD  metFORMIN (GLUCOPHAGE-XR) 500 MG 24 hr tablet Take 1,000 mg by mouth 2 (two) times daily.    Yes Historical Provider, MD  Methylsulfonylmethane (MSM) 1500 MG TABS Take 1,000 mg by mouth daily.    Yes Historical Provider, MD  Multiple Vitamin (MULTIVITAMIN) capsule Take 1 capsule by mouth daily.     Yes Historical Provider, MD  omeprazole (PRILOSEC) 20 MG capsule Take 20 mg by mouth daily.    Yes Historical Provider, MD  rivastigmine (EXELON) 4.6 mg/24hr Place 4.6 mg onto the skin daily.   Yes Historical Provider, MD  simvastatin (ZOCOR) 40 MG tablet Take 40 mg by mouth at bedtime.  06/14/11  Yes Historical Provider, MD  vitamin C (ASCORBIC ACID) 500 MG tablet Take 500 mg by mouth daily.   Yes Historical Provider, MD  zinc gluconate 50 MG tablet Take 50 mg by mouth daily.     Yes Historical Provider, MD  lisinopril (PRINIVIL,ZESTRIL) 10 MG tablet Take 10 mg by mouth daily.    Historical Provider, MD   BP 142/72 mmHg  Pulse 67  Temp(Src) 98.6 F (37 C) (Oral)  Resp 16  Ht 6\' 1"  (1.854 m)  Wt 256 lb (116.121 kg)  BMI 33.78 kg/m2  SpO2 97% Physical Exam CONSTITUTIONAL: Well developed/well nourished HEAD: Normocephalic/atraumatic EYES: EOMI/PERRL ENMT: Mucous membranes moist NECK: supple no meningeal signs SPINE/BACK:entire spine  nontender CV: S1/S2 noted, no murmurs/rubs/gallops noted LUNGS: Lungs are clear to auscultation bilaterally, no apparent distress ABDOMEN: soft, nontender, no rebound or guarding, bowel sounds noted throughout abdomen NEURO: Pt is awake/alert/appropriate, moves all extremitiesx4.  No facial droop.   No arm drift. Equal hand grips noted.  No ataxia EXTREMITIES: pulses normal/equal, full ROM SKIN: warm, color normal PSYCH: no abnormalities of mood noted, alert and oriented to  situation  ED Course  Procedures  8:51 PM Pt with long h/o episodes with burning from head to abdomen I have seen this mentioned in previous consultant notes He is well appearing, no distress Will order labs and reassess.  I don't feel acute neuroimaging is required at this time 9:34 PM Pt well appearing Labs reassuring As this has been a chronic process I advised f/u as outpatient with PCP and also referred back to neuro (he has seen neuro for this previously)   Labs Review Labs Reviewed  BASIC METABOLIC PANEL - Abnormal; Notable for the following:    Sodium 136 (*)    Glucose, Bld 125 (*)    All other components within normal limits  URINALYSIS, ROUTINE W REFLEX MICROSCOPIC - Abnormal; Notable for the following:    Specific Gravity, Urine <1.005 (*)    Glucose, UA 100 (*)    All other components within normal limits  CBC WITH DIFFERENTIAL      MDM   Final diagnoses:  Other headache syndrome  Dysuria    Nursing notes including past medical history and social history reviewed and considered in documentation Previous records reviewed and considered Labs/vital reviewed myself and considered during evaluation     Sharyon Cable, MD 08/11/14 2136

## 2014-08-11 NOTE — ED Notes (Signed)
Pt alert & oriented x4, stable gait. Patient  given discharge instructions, paperwork & prescription(s). Patient verbalized understanding. Pt left department w/ no further questions. 

## 2014-08-11 NOTE — Discharge Instructions (Signed)
°  SEEK MEDICAL ATTENTION IF: ° °You develop possible problems with medications prescribed.  °The medications don't resolve your headache, if it recurs , or if you have multiple episodes of vomiting or can't take fluids. °You have a change from the usual headache. ° °RETURN IMMEDIATELY IF you develop a sudden, severe headache or confusion, become poorly responsive or faint, develop a fever above 100.4F or problem breathing, have a change in speech, vision, swallowing, or understanding, or develop new weakness, numbness, tingling, incoordination, or have a seizure. ° °

## 2014-08-11 NOTE — ED Notes (Signed)
Spouse reports pt has had hx of feeling a burning sensation in his head that radiates to his bladder, makes pt feel like he has to urinate but he doesn't. Pt has early dementia. Wife states pt has been seen by his doctor and cause is unknown. Symptoms are worse today than usual.

## 2014-08-14 DIAGNOSIS — R300 Dysuria: Secondary | ICD-10-CM | POA: Diagnosis not present

## 2014-08-14 DIAGNOSIS — Z23 Encounter for immunization: Secondary | ICD-10-CM | POA: Diagnosis not present

## 2014-08-18 DIAGNOSIS — I10 Essential (primary) hypertension: Secondary | ICD-10-CM | POA: Diagnosis not present

## 2014-08-18 DIAGNOSIS — G4733 Obstructive sleep apnea (adult) (pediatric): Secondary | ICD-10-CM | POA: Diagnosis not present

## 2014-08-26 DIAGNOSIS — F0390 Unspecified dementia without behavioral disturbance: Secondary | ICD-10-CM | POA: Diagnosis not present

## 2014-08-26 DIAGNOSIS — R51 Headache: Secondary | ICD-10-CM | POA: Diagnosis not present

## 2014-08-26 DIAGNOSIS — R300 Dysuria: Secondary | ICD-10-CM | POA: Diagnosis not present

## 2014-08-26 DIAGNOSIS — E119 Type 2 diabetes mellitus without complications: Secondary | ICD-10-CM | POA: Diagnosis not present

## 2014-08-26 DIAGNOSIS — Z87891 Personal history of nicotine dependence: Secondary | ICD-10-CM | POA: Diagnosis not present

## 2014-09-04 DIAGNOSIS — N529 Male erectile dysfunction, unspecified: Secondary | ICD-10-CM | POA: Diagnosis not present

## 2014-09-04 DIAGNOSIS — E1165 Type 2 diabetes mellitus with hyperglycemia: Secondary | ICD-10-CM | POA: Diagnosis not present

## 2014-09-04 DIAGNOSIS — G4733 Obstructive sleep apnea (adult) (pediatric): Secondary | ICD-10-CM | POA: Diagnosis not present

## 2014-09-04 DIAGNOSIS — F0281 Dementia in other diseases classified elsewhere with behavioral disturbance: Secondary | ICD-10-CM | POA: Diagnosis not present

## 2014-09-09 DIAGNOSIS — R3915 Urgency of urination: Secondary | ICD-10-CM | POA: Diagnosis not present

## 2014-09-09 DIAGNOSIS — R1084 Generalized abdominal pain: Secondary | ICD-10-CM | POA: Diagnosis not present

## 2014-09-09 DIAGNOSIS — R300 Dysuria: Secondary | ICD-10-CM | POA: Diagnosis not present

## 2014-09-30 DIAGNOSIS — E1165 Type 2 diabetes mellitus with hyperglycemia: Secondary | ICD-10-CM | POA: Diagnosis not present

## 2014-09-30 DIAGNOSIS — R3915 Urgency of urination: Secondary | ICD-10-CM | POA: Diagnosis not present

## 2014-09-30 DIAGNOSIS — H6691 Otitis media, unspecified, right ear: Secondary | ICD-10-CM | POA: Diagnosis not present

## 2014-09-30 DIAGNOSIS — F0281 Dementia in other diseases classified elsewhere with behavioral disturbance: Secondary | ICD-10-CM | POA: Diagnosis not present

## 2014-10-06 DIAGNOSIS — R300 Dysuria: Secondary | ICD-10-CM | POA: Diagnosis not present

## 2014-12-25 ENCOUNTER — Telehealth: Payer: Self-pay | Admitting: Neurology

## 2014-12-25 NOTE — Telephone Encounter (Signed)
Pt wife [name] called and states that she was returning your call please call her at [phone]

## 2014-12-25 NOTE — Telephone Encounter (Signed)
I left message for this patient to return my call

## 2015-02-11 ENCOUNTER — Encounter: Payer: Self-pay | Admitting: *Deleted

## 2015-02-11 ENCOUNTER — Ambulatory Visit (INDEPENDENT_AMBULATORY_CARE_PROVIDER_SITE_OTHER): Payer: Medicare Other | Admitting: Cardiology

## 2015-02-11 ENCOUNTER — Encounter: Payer: Self-pay | Admitting: Cardiology

## 2015-02-11 VITALS — BP 158/90 | HR 60 | Ht 74.0 in | Wt 263.0 lb

## 2015-02-11 DIAGNOSIS — I2510 Atherosclerotic heart disease of native coronary artery without angina pectoris: Secondary | ICD-10-CM | POA: Diagnosis not present

## 2015-02-11 DIAGNOSIS — R413 Other amnesia: Secondary | ICD-10-CM

## 2015-02-11 DIAGNOSIS — I77810 Thoracic aortic ectasia: Secondary | ICD-10-CM | POA: Diagnosis not present

## 2015-02-11 DIAGNOSIS — R001 Bradycardia, unspecified: Secondary | ICD-10-CM | POA: Diagnosis not present

## 2015-02-11 DIAGNOSIS — I471 Supraventricular tachycardia: Secondary | ICD-10-CM

## 2015-02-11 NOTE — Assessment & Plan Note (Signed)
We know that he had nonobstructive coronary disease by cath in May, 2014. I feel that his current symptoms are not cardiac in origin. No further workup.

## 2015-02-11 NOTE — Assessment & Plan Note (Signed)
The patient had very mild aortic root dilatation in the past. I've chosen not to repeat an echo at this time.

## 2015-02-11 NOTE — Assessment & Plan Note (Signed)
The patient has early  Alzheimer's. This is being managed by his primary team.

## 2015-02-11 NOTE — Patient Instructions (Signed)

## 2015-02-11 NOTE — Assessment & Plan Note (Signed)
The patient has had very limited supraventricular tachycardia on a Holter in the past. He is never had proven atrial fibrillation or flutter. He is not having any recurrent significant palpitations. He is not having any symptomatic bradycardia. He has had no recurrent presyncope. No further workup.

## 2015-02-11 NOTE — Progress Notes (Signed)
Cardiology Office Note   Date:  02/11/2015   ID:  Joshua Khan, DOB 12/29/1945, MRN [id number]  PCP:  Gar Ponto, MD  Cardiologist:  Dola Argyle, MD   Chief Complaint  Patient presents with  . Appointment    Follow-up coronary artery disease      History of Present Illness: Joshua Khan is a 69 y.o. male who presents to follow-up his overall cardiac status. I saw him last June, 2015. It is documented by his primary team that he has early Alzheimer's. He mentions to me that he is concerned about burning in his urine. I encouraged him to follow-up with his primary team. He then told me he does have some intermittent chest discomfort that is very vague. It is not exertional. It is random. It does not sound like angina.    Past Medical History  Diagnosis Date  . Hypercholesterolemia   . 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 Barrett's esophagus   . S/P endoscopy August 2007    Dr. Oneida Alar: chronic active gastritis, +H.pylori, ? treatment?, short-segment Barrett's  . S/P colonoscopy     ? awaiting records?  . Sleep apnea     yes c pap  . Cancer     basal ceel removed ,  one on head and one on right arm  . Blood transfusion     as child  . GERD (gastroesophageal reflux disease)     takes  prilosec  . Arthritis     hands  . Anxiety     yes here [place]  , no meds  . Diabetes mellitus     ,  poor circulation  . Pneumonia     3 times as baby  . Hiatal hernia   . CAD (coronary artery disease)     Nonobstructive coronary disease by cath, 2007  . Pre-syncope     May, 2015  . Ejection fraction     EF 60%, catheterization, 2007  //   EF 60%, echo, April, 2014 while in hospital  . Sinus bradycardia     Asymptomatic in 2007  . Aortic root dilatation     Mild, echo, April, 2014  . SVT (supraventricular tachycardia)     5 beats atrial tachycardia 48 hour Holter, May, 2014, no atrial fibrillation, no atrial flutter.  . Memory change     April, 2014, patient is  scheduled for neurologic testing Jan 25, 2013  . Chronic back pain   . Lumbar radiculopathy   . Chronic neck pain   . Cervical radiculopathy     Past Surgical History  Procedure Laterality Date  . Neck surgery    . External ear surgery    . Hand surgery    . Vasectomy    . Anal fissure repair    . Skin cancer removal    . Tonsillectomy      30's  . Esophagogastroduodenoscopy  06/29/11    barrett's esophagus/hiatal hernia/mild gastristis, s/p Savary dilation, path with no sprue, mild chronic gastritis noted, negative H.pylori, intestinal metaplasia consistent with barrett's, no dysplasia  . Colonoscopy  06/29/11    internal hemorrhoids/severe diverticulosis, path negative for microscopic colitis repeat Oct 2017 with 2 days of clear liquids and Moviprep    Patient Active Problem List   Diagnosis Date Noted  . Seizures 01/25/2013  . SVT (supraventricular tachycardia)   . Memory change   . CAD (coronary artery disease)   . Pre-syncope   .  Sinus bradycardia   . Ejection fraction   . Anxiety   . Aortic root dilatation   . Loose stools 06/21/2011  . 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 DM 05/19/2009  . HYPERLIPIDEMIA 05/19/2009      Current Outpatient Prescriptions  Medication Sig Dispense Refill  . calcium carbonate (OS-CAL) 600 MG TABS tablet Take 600 mg by mouth daily.    . COD LIVER OIL PO Take 5 mLs by mouth daily.     . fish oil-omega-3 fatty acids 1000 MG capsule Take 2 g by mouth daily.    . Garlic 5465 MG CAPS Take 1,000 mg by mouth daily.      Marland Kitchen lisinopril (PRINIVIL,ZESTRIL) 10 MG tablet Take 10 mg by mouth daily.    . memantine (NAMENDA) 5 MG tablet Take 5 mg by mouth daily.    . metFORMIN (GLUCOPHAGE-XR) 500 MG 24 hr tablet Take 1,000 mg by mouth 2 (two) times daily.     . Methylsulfonylmethane (MSM) 1500 MG TABS Take 1,000 mg by mouth daily.     . Multiple Vitamin (MULTIVITAMIN) capsule Take 1 capsule by mouth daily.      Marland Kitchen omeprazole (PRILOSEC) 20 MG capsule Take  20 mg by mouth daily.     . rivastigmine (EXELON) 4.6 mg/24hr Place 4.6 mg onto the skin daily.    . simvastatin (ZOCOR) 40 MG tablet Take 40 mg by mouth at bedtime.     Marland Kitchen UNKNOWN TO PATIENT Patch for dementia    . vitamin C (ASCORBIC ACID) 500 MG tablet Take 500 mg by mouth daily.    Marland Kitchen zinc gluconate 50 MG tablet Take 50 mg by mouth daily.       No current facility-administered medications for this visit.    Allergies:   Aspirin; Codeine; and Sulfa antibiotics    Social History:  The patient  reports that he has quit smoking. His smoking use included Cigarettes. He has never used smokeless tobacco. He reports that he does not drink alcohol or use illicit drugs.   Family History:  The patient's family history includes Diabetes type II in his father; High blood pressure in his father. There is no history of Colon cancer.    ROS:  Please see the history of present illness.   Patient denies fever, chills, headache, sweats, rash, change in vision, change in hearing, cough, nausea or vomiting,. All other systems are reviewed and are negative.   PHYSICAL EXAM: VS:  BP 158/90 mmHg  Pulse 60  Ht 6\' 2"  (1.88 m)  Wt 263 lb (119.296 kg)  BMI 33.75 kg/m2  SpO2 97% , The patient is here with his wife. She did mention to me that she knows that he has early Alzheimer's. He is oriented to person time and place today. Affect is normal. Head is atraumatic. Sclera and conjunctiva are normal per there is no jugulovenous distention. Lungs are clear. Respiratory effort is nonlabored. Cardiac exam reveals S1 and S2. The abdomen is soft. Is no peripheral edema. There are no musculoskeletal deformities. There are no skin rashes. He is overweight.  EKG:   EKG is done today and reviewed by me. There is normal sinus rhythm. There is no change from the past.   Recent Labs: 08/11/2014: BUN 7; Creatinine, Ser 0.76; Hemoglobin 14.8; Platelets 202; Potassium 3.9; Sodium 136*    Lipid Panel No results found for:  CHOL, TRIG, HDL, CHOLHDL, VLDL, LDLCALC, LDLDIRECT    Wt Readings from Last 3 Encounters:  02/11/15 263 lb (  119.296 kg)  08/11/14 256 lb (116.121 kg)  02/10/14 264 lb (119.75 kg)      Current medicines are reviewed  The patient's wife understands his medicines.     ASSESSMENT AND PLAN:

## 2015-09-28 ENCOUNTER — Ambulatory Visit: Payer: Medicare Other

## 2015-09-28 DIAGNOSIS — M79644 Pain in right finger(s): Secondary | ICD-10-CM

## 2015-09-28 DIAGNOSIS — Z6834 Body mass index (BMI) 34.0-34.9, adult: Secondary | ICD-10-CM | POA: Diagnosis not present

## 2015-09-30 ENCOUNTER — Telehealth: Payer: Self-pay | Admitting: Orthopaedic Surgery

## 2015-09-30 NOTE — Telephone Encounter (Signed)
Patient referral to Dr Fredna Dow, hand specialist, referral date 09/28/15 -- patient has been been scheduled for 10/05/15, per Shirlean Mylar at [facility]; patient is aware.

## 2015-10-01 ENCOUNTER — Encounter: Payer: Self-pay | Admitting: Neurology

## 2015-10-01 ENCOUNTER — Ambulatory Visit (INDEPENDENT_AMBULATORY_CARE_PROVIDER_SITE_OTHER): Payer: Medicare Other | Admitting: Neurology

## 2015-10-01 ENCOUNTER — Telehealth: Payer: Self-pay | Admitting: Neurology

## 2015-10-01 VITALS — BP 138/81 | HR 64 | Ht 74.0 in | Wt 251.0 lb

## 2015-10-01 DIAGNOSIS — F0390 Unspecified dementia without behavioral disturbance: Secondary | ICD-10-CM

## 2015-10-01 DIAGNOSIS — F419 Anxiety disorder, unspecified: Secondary | ICD-10-CM | POA: Diagnosis not present

## 2015-10-01 MED ORDER — DONEPEZIL HCL 10 MG PO TABS
10.0000 mg | ORAL_TABLET | Freq: Every day | ORAL | Status: DC
Start: 2015-10-01 — End: 2015-10-26

## 2015-10-01 MED ORDER — MEMANTINE HCL 10 MG PO TABS: 10.0000 mg | ORAL_TABLET | Freq: Two times a day (BID) | ORAL | Status: DC

## 2015-10-01 MED ORDER — QUETIAPINE FUMARATE 25 MG PO TABS: 25.0000 mg | ORAL_TABLET | Freq: Every day | ORAL | Status: DC

## 2015-10-01 NOTE — Telephone Encounter (Signed)
He wants a man doctor, instead of women doctor.

## 2015-10-01 NOTE — Progress Notes (Signed)
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  I saw him in 2014, he reported recurrent episode of transient confusion, unresponsive, staring into space, slurred speech, lasting for a few minutes, he has no recollection of the event.  There was one episode,while he was driving, he suddenly felt weird, confused, he wants to change seat with his son, then he was noticed staring into space, lasting for a few minutes, he had pressure sensation across his chest, traveling down to his scrotum,  He had multiple episodes over the past one month, there was no generalized seizure activity noticed, he was put into hospital in Jan 15 2013 for recurrent episodes   Coronary artery angiogram showed diffuse coronary ectasia with nonobstructive stenosis primarily in the LAD and right coronary artery. Normal left ventricular systolic function   He had 7 years of education, retired at age 70, since retirement, he spent 7 years,was able to get his GED diploma  .  EEG in 2014 showed diffuse slowing, there was no epileptiform discharge He could not tolerate Keppra, he became very moody, he has quit taking Keppra there was no recurrent confusion episode per patient, at previous evaluation in July 2014, his Mini-Mental status was 25 out of 30, he missed 3 out of 3 recalls.  I have reviewed MRI of the brain in September 21 2015 from [facility], compared to previous scan in 2014, mild to moderate generalized  atrophy, supratentorium small vessel disease, no acute lesions.  he is now taking Namenda 5 mg twice a day,   REVIEW OF SYSTEMS: Full 14 system review of systems performed and notable only for hearing loss, ringing ears, apnea, painful urination, agitation, behavior problem, confusion, memory loss  ALLERGIES: Allergies  Allergen Reactions  . Aspirin     Cannot take if not coated, makes stomach burn  . Codeine     REACTION: UNKNOWN REACTION  . Sulfa Antibiotics     Not sure of reaction    HOME MEDICATIONS: Current Outpatient Prescriptions  Medication Sig Dispense Refill  . calcium carbonate (OS-CAL) 600 MG TABS tablet Take 1,000 mg by mouth daily.     . COD LIVER OIL PO Take 5 mLs by mouth daily.     . fish oil-omega-3 fatty acids 1000 MG capsule Take 2 g by mouth daily.    . Garlic 123XX123 MG CAPS Take 1,000 mg by mouth daily.      Marland Kitchen lisinopril (PRINIVIL,ZESTRIL) 10 MG tablet Take 10 mg by mouth daily.    . memantine (NAMENDA) 5 MG tablet Take 5 mg by mouth daily.    . metFORMIN (GLUCOPHAGE-XR) 500 MG 24 hr tablet Take 1,000 mg by mouth 2 (two) times daily.     . Methylsulfonylmethane (MSM) 1500 MG TABS Take 1,000 mg by mouth daily.     . Multiple Vitamin (MULTIVITAMIN) capsule Take 1 capsule by mouth daily.      Marland Kitchen omeprazole (PRILOSEC) 20 MG capsule Take 20 mg  by mouth daily.     . rivastigmine (EXELON) 4.6 mg/24hr Place 4.6 mg onto the skin daily.    . simvastatin (ZOCOR) 40 MG tablet Take 40 mg by mouth at bedtime.     Marland Kitchen UNKNOWN TO PATIENT Patch for dementia    . vitamin C (ASCORBIC ACID) 500 MG tablet Take 500 mg by mouth daily.    Marland Kitchen zinc gluconate 50 MG tablet Take 50 mg by mouth daily.       No current facility-administered medications for this visit.    PAST MEDICAL HISTORY: Past Medical History  Diagnosis Date  . Hypercholesterolemia   . 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 Barrett's esophagus   . S/P endoscopy August 2007    Dr. Oneida Alar: chronic active  gastritis, +H.pylori, ? treatment?, short-segment Barrett's  . S/P colonoscopy     ? awaiting records?  . Sleep apnea     yes c pap  . Cancer ([place])     basal ceel removed ,  one on head and one on right arm  . Blood transfusion     as child  . GERD (gastroesophageal reflux disease)     takes  prilosec  . Arthritis     hands  . Anxiety     yes here [place]  , no meds  . Diabetes mellitus     ,  poor circulation  . Pneumonia     3 times as baby  . Hiatal hernia   . CAD (coronary artery disease)     Nonobstructive coronary disease by cath, 2007  . Pre-syncope     May, 2015  . Ejection fraction     EF 60%, catheterization, 2007  //   EF 60%, echo, April, 2014 while in hospital  . Sinus bradycardia     Asymptomatic in 2007  . Aortic root dilatation (HCC)     Mild, echo, April, 2014  . SVT (supraventricular tachycardia) (HCC)     5 beats atrial tachycardia 48 hour Holter, May, 2014, no atrial fibrillation, no atrial flutter.  . Memory change     April, 2014, patient is scheduled for neurologic testing Jan 25, 2013  . Chronic back pain   . Lumbar radiculopathy   . Chronic neck pain   . Cervical radiculopathy     PAST SURGICAL HISTORY: Past Surgical History  Procedure Laterality Date  . Neck surgery    . External ear surgery    . Hand surgery    . Vasectomy    . Anal fissure repair    . Skin cancer removal    . Tonsillectomy      30's  . Esophagogastroduodenoscopy  06/29/11    barrett's esophagus/hiatal hernia/mild gastristis, s/p Savary dilation, path with no sprue, mild chronic gastritis noted, negative H.pylori, intestinal metaplasia consistent with barrett's, no dysplasia  . Colonoscopy  06/29/11    internal hemorrhoids/severe diverticulosis, path negative for microscopic colitis repeat Oct 2017 with 2 days of clear liquids and Moviprep    FAMILY HISTORY: Family History  Problem Relation Age of Onset  . Colon cancer Neg Hx   . High blood pressure Father   .  Diabetes type II Father     SOCIAL HISTORY:  Social History   Social History  . Marital Status: Married    Spouse Name: yvonne  . Number of Children: 3  . Years of Education: GED   Occupational History  . RETIRED from [employer].  Social History Main Topics  . Smoking status: Former Smoker    Types: Cigarettes  . Smokeless tobacco: Never Used     Comment: smoked age [age]  . Alcohol Use: No     Comment: Quit 70 years old  . Drug Use: No  . Sexual Activity: Not Currently   Other Topics Concern  . Not on file   Social History Narrative   Patient lives at home with his wife [name]). Patient is retired. Patient has his GED. Caffeine two cups daily. Right handed.     PHYSICAL EXAM   Filed Vitals:   10/01/15 1007  BP: 138/81  Pulse: 64  Height: 6\' 2"  (1.88 m)  Weight: 251 lb (113.853 kg)    Not recorded      Body mass index is 32.21 kg/(m^2).  PHYSICAL EXAMNIATION:  Gen: NAD, conversant, well nourised, obese, well groomed                     Cardiovascular: Regular rate rhythm, no peripheral edema, warm, nontender. Eyes: Conjunctivae clear without exudates or hemorrhage Neck: Supple, no carotid bruise. Pulmonary: Clear to auscultation bilaterally   NEUROLOGICAL EXAM:  MENTAL STATUS: Speech:    Speech is normal; fluent and spontaneous with normal comprehension.  Cognition: Mini-Mental Status Examination is 17 out of 30, animal naming is 8     Orientation: He is not oriented to year, day, Dr.     Recent and remote memory: He missed 3 out of 3 recalls     Attention span and concentration: He has difficulty spell world backwards     Normal Language, naming, repeating,spontaneous speech     He could not copy design or write  a sentence   CRANIAL NERVES: CN II: Visual fields are full to confrontation. Pupils are round equal and briskly reactive to light. CN III, IV, VI: extraocular movement are normal. No ptosis. CN V: Facial sensation is intact to  pinprick in all 3 divisions bilaterally. Corneal responses are intact.  CN VII: Face is symmetric with normal eye closure and smile. CN VIII: Hearing is normal to rubbing fingers CN IX, X: Palate elevates symmetrically. Phonation is normal. CN XI: Head turning and shoulder shrug are intact CN XII: Tongue is midline with normal movements and no atrophy.  MOTOR: There is no pronator drift of out-stretched arms. Muscle bulk and tone are normal. Muscle strength is normal.  REFLEXES: Reflexes are hypoactive and symmetric at the biceps, triceps, knees, and ankles. Plantar responses are flexor.  SENSORY: Intact to light touch, pinprick, position sense, and vibration sense are intact in fingers and toes.  COORDINATION: Rapid alternating movements and fine finger movements are intact. There is no dysmetria on finger-to-nose and heel-knee-shin.    GAIT/STANCE: Mildly unsteady, cautious gait,   DIAGNOSTIC DATA (LABS, IMAGING, TESTING) - I reviewed patient records, labs, notes, testing and imaging myself where available.   ASSESSMENT AND PLAN  AARIK BELLOFATTO is a 70 y.o. male    dementia  Mini-Mental Status Examination 17 out of 30,  Increase Namenda to 10 mg twice a day, add on Aricept 10 mg daily   history of traumatic brain injury  MRI of the brain showed mild to moderate generalized atrophy supratentorium small vessel disease  Moodiness  I have add on seroquel 25 mg every night,   He prefer a male provider, will change to different provider in 6 months follow-up visit   Marcial Pacas, M.D. Ph.D.  [facility]  [address], [place], [place] [address] Ph: [phone] Fax: [phone]  CC: Caryl Bis, MD

## 2015-10-02 ENCOUNTER — Telehealth: Payer: Self-pay | Admitting: Neurology

## 2015-10-02 NOTE — Telephone Encounter (Signed)
FYI- Called pt in regards to request to switch providers.  Pt was very confused and began to get irritated.  I asked if I could speak with his wife.  She advised that he has Alzheimer's disease and he is exhausting her.  She said he doesn't want to see male doctors at all.  I advised that we would just need to have something in writing to make that one time change for him.  I informed her that it was just so we could reference why the change if we had any questions in the future and to make sure that we matched him with the best provider.  She stated that she would have her daughter-in-law write up a short letter and have Joshua Khan sign it.

## 2015-10-05 ENCOUNTER — Telehealth: Payer: Self-pay | Admitting: Orthopaedic Surgery

## 2015-10-05 NOTE — Telephone Encounter (Signed)
[name] daughter in law called to schedule an 2nd opinion with Dr. Aline Brochure. She stated that her father in law had seen Dr. Luna Glasgow and was referred to a [facility] manager in [place]. He kept his appointment this morning and they suggested surgery, but [name] wants a 2nd opinion. I checked with Arbie Cookey and was told to let [name] know that he would need to schedule a 2nd opinion with another hand specialist. I told [name] son Joshua Khan that Dr. Aline Brochure was not a hand specialist and I was told that he would take his father to someone in [place].

## 2015-10-19 ENCOUNTER — Other Ambulatory Visit: Payer: Self-pay | Admitting: Orthopedic Surgery

## 2015-10-26 ENCOUNTER — Encounter (HOSPITAL_BASED_OUTPATIENT_CLINIC_OR_DEPARTMENT_OTHER): Payer: Self-pay | Admitting: *Deleted

## 2015-10-26 NOTE — Progress Notes (Addendum)
Bring all medications. Wife and son will be coming with pt - he has dementia. Dr. Al Corpus reviewed  Cardiology note and Ekg from 01/2014 - ok for surgery

## 2015-10-29 ENCOUNTER — Ambulatory Visit (HOSPITAL_BASED_OUTPATIENT_CLINIC_OR_DEPARTMENT_OTHER): Payer: Medicare Other | Admitting: Certified Registered"

## 2015-10-29 ENCOUNTER — Encounter (HOSPITAL_BASED_OUTPATIENT_CLINIC_OR_DEPARTMENT_OTHER)
Admission: RE | Disposition: A | Discharge status: Home / Self Care | Payer: Self-pay | Source: Ambulatory Visit | Attending: Orthopedic Surgery

## 2015-10-29 ENCOUNTER — Encounter (HOSPITAL_BASED_OUTPATIENT_CLINIC_OR_DEPARTMENT_OTHER): Payer: Self-pay | Admitting: Certified Registered"

## 2015-10-29 ENCOUNTER — Ambulatory Visit (HOSPITAL_BASED_OUTPATIENT_CLINIC_OR_DEPARTMENT_OTHER)
Admission: RE | Admit: 2015-10-29 | Discharge: 2015-10-29 | Disposition: A | Discharge status: Home / Self Care | Payer: Medicare Other | Source: Ambulatory Visit | Attending: Orthopedic Surgery | Admitting: Orthopedic Surgery

## 2015-10-29 DIAGNOSIS — Z6833 Body mass index (BMI) 33.0-33.9, adult: Secondary | ICD-10-CM | POA: Diagnosis not present

## 2015-10-29 DIAGNOSIS — I2510 Atherosclerotic heart disease of native coronary artery without angina pectoris: Secondary | ICD-10-CM | POA: Diagnosis not present

## 2015-10-29 DIAGNOSIS — Z87891 Personal history of nicotine dependence: Secondary | ICD-10-CM | POA: Insufficient documentation

## 2015-10-29 DIAGNOSIS — G4733 Obstructive sleep apnea (adult) (pediatric): Secondary | ICD-10-CM | POA: Diagnosis not present

## 2015-10-29 DIAGNOSIS — M65331 Trigger finger, right middle finger: Secondary | ICD-10-CM | POA: Diagnosis present

## 2015-10-29 DIAGNOSIS — Z7984 Long term (current) use of oral hypoglycemic drugs: Secondary | ICD-10-CM | POA: Diagnosis not present

## 2015-10-29 DIAGNOSIS — K219 Gastro-esophageal reflux disease without esophagitis: Secondary | ICD-10-CM | POA: Diagnosis not present

## 2015-10-29 DIAGNOSIS — Z79899 Other long term (current) drug therapy: Secondary | ICD-10-CM | POA: Insufficient documentation

## 2015-10-29 DIAGNOSIS — I471 Supraventricular tachycardia: Secondary | ICD-10-CM | POA: Insufficient documentation

## 2015-10-29 DIAGNOSIS — E119 Type 2 diabetes mellitus without complications: Secondary | ICD-10-CM | POA: Insufficient documentation

## 2015-10-29 DIAGNOSIS — E7800 Pure hypercholesterolemia, unspecified: Secondary | ICD-10-CM | POA: Insufficient documentation

## 2015-10-29 DIAGNOSIS — F0390 Unspecified dementia without behavioral disturbance: Secondary | ICD-10-CM | POA: Insufficient documentation

## 2015-10-29 DIAGNOSIS — M24541 Contracture, right hand: Secondary | ICD-10-CM | POA: Insufficient documentation

## 2015-10-29 DIAGNOSIS — M1990 Unspecified osteoarthritis, unspecified site: Secondary | ICD-10-CM | POA: Insufficient documentation

## 2015-10-29 HISTORY — PX: TRIGGER FINGER RELEASE: SHX641

## 2015-10-29 LAB — GLUCOSE, CAPILLARY
GLUCOSE-CAPILLARY: 145 mg/dL — AB (ref 65–99)
Glucose-Capillary: 149 mg/dL — ABNORMAL HIGH (ref 65–99)

## 2015-10-29 SURGERY — RELEASE, A1 PULLEY, FOR TRIGGER FINGER
Anesthesia: Monitor Anesthesia Care | Site: Finger | Laterality: Right

## 2015-10-29 MED ORDER — PROPOFOL 500 MG/50ML IV EMUL
INTRAVENOUS | Status: AC
  Filled 2015-10-29: qty 50

## 2015-10-29 MED ORDER — MIDAZOLAM HCL 2 MG/2ML IJ SOLN: 1.0000 mg | INTRAMUSCULAR | Status: DC | PRN

## 2015-10-29 MED ORDER — GLYCOPYRROLATE 0.2 MG/ML IJ SOLN: 0.2000 mg | Freq: Once | INTRAMUSCULAR | Status: DC | PRN

## 2015-10-29 MED ORDER — CHLORHEXIDINE GLUCONATE 4 % EX LIQD: 60.0000 mL | Freq: Once | CUTANEOUS | Status: DC

## 2015-10-29 MED ORDER — BUPIVACAINE HCL (PF) 0.25 % IJ SOLN
INTRAMUSCULAR | Status: DC | PRN
  Administered 2015-10-29: 6 mL/h

## 2015-10-29 MED ORDER — LIDOCAINE HCL (PF) 1 % IJ SOLN
INTRAMUSCULAR | Status: AC
  Filled 2015-10-29: qty 30

## 2015-10-29 MED ORDER — ATROPINE SULFATE 0.4 MG/ML IJ SOLN
INTRAMUSCULAR | Status: AC
  Filled 2015-10-29: qty 1

## 2015-10-29 MED ORDER — LIDOCAINE HCL (CARDIAC) 20 MG/ML IV SOLN
INTRAVENOUS | Status: AC
  Filled 2015-10-29: qty 5

## 2015-10-29 MED ORDER — FENTANYL CITRATE (PF) 100 MCG/2ML IJ SOLN: 25.0000 ug | INTRAMUSCULAR | Status: DC | PRN

## 2015-10-29 MED ORDER — PROPOFOL 10 MG/ML IV BOLUS
INTRAVENOUS | Status: DC | PRN
  Administered 2015-10-29: 20 mg via INTRAVENOUS
  Administered 2015-10-29: 10 mg via INTRAVENOUS

## 2015-10-29 MED ORDER — CEFAZOLIN SODIUM-DEXTROSE 2-3 GM-% IV SOLR
INTRAVENOUS | Status: AC
  Filled 2015-10-29: qty 50

## 2015-10-29 MED ORDER — SCOPOLAMINE 1 MG/3DAYS TD PT72
1.0000 | MEDICATED_PATCH | Freq: Once | TRANSDERMAL | Status: DC | PRN
Start: 2015-10-29 — End: 2015-10-29

## 2015-10-29 MED ORDER — ONDANSETRON HCL 4 MG/2ML IJ SOLN
INTRAMUSCULAR | Status: AC
  Filled 2015-10-29: qty 2

## 2015-10-29 MED ORDER — TRAMADOL HCL 50 MG PO TABS: 50.0000 mg | ORAL_TABLET | Freq: Four times a day (QID) | ORAL | Status: DC | PRN

## 2015-10-29 MED ORDER — LIDOCAINE HCL (PF) 0.5 % IJ SOLN
INTRAMUSCULAR | Status: DC | PRN
  Administered 2015-10-29: 30 mL via INTRAVENOUS

## 2015-10-29 MED ORDER — BUPIVACAINE HCL (PF) 0.25 % IJ SOLN
INTRAMUSCULAR | Status: AC
  Filled 2015-10-29: qty 60

## 2015-10-29 MED ORDER — LACTATED RINGERS IV SOLN
INTRAVENOUS | Status: DC
  Administered 2015-10-29: 08:00:00 via INTRAVENOUS

## 2015-10-29 MED ORDER — FENTANYL CITRATE (PF) 100 MCG/2ML IJ SOLN
INTRAMUSCULAR | Status: AC
  Filled 2015-10-29: qty 2

## 2015-10-29 MED ORDER — FENTANYL CITRATE (PF) 100 MCG/2ML IJ SOLN: 50.0000 ug | INTRAMUSCULAR | Status: DC | PRN

## 2015-10-29 MED ORDER — CEFAZOLIN SODIUM-DEXTROSE 2-3 GM-% IV SOLR
2.0000 g | INTRAVENOUS | Status: AC
  Administered 2015-10-29: 2 g via INTRAVENOUS

## 2015-10-29 MED ORDER — MEPERIDINE HCL 25 MG/ML IJ SOLN: 6.2500 mg | INTRAMUSCULAR | Status: DC | PRN

## 2015-10-29 SURGICAL SUPPLY — 32 items
BANDAGE COBAN STERILE 2 (GAUZE/BANDAGES/DRESSINGS) ×3 IMPLANT
BLADE SURG 15 STRL LF DISP TIS (BLADE) ×1 IMPLANT
BLADE SURG 15 STRL SS (BLADE) ×2
BNDG ESMARK 4X9 LF (GAUZE/BANDAGES/DRESSINGS) IMPLANT
CHLORAPREP W/TINT 26ML (MISCELLANEOUS) ×3 IMPLANT
CORDS BIPOLAR (ELECTRODE) IMPLANT
COVER BACK TABLE 60X90IN (DRAPES) ×3 IMPLANT
COVER MAYO STAND STRL (DRAPES) ×3 IMPLANT
CUFF TOURNIQUET SINGLE 18IN (TOURNIQUET CUFF) IMPLANT
DECANTER SPIKE VIAL GLASS SM (MISCELLANEOUS) IMPLANT
DRAPE EXTREMITY T 121X128X90 (DRAPE) ×3 IMPLANT
DRAPE SURG 17X23 STRL (DRAPES) ×3 IMPLANT
GAUZE SPONGE 4X4 12PLY STRL (GAUZE/BANDAGES/DRESSINGS) ×3 IMPLANT
GAUZE XEROFORM 1X8 LF (GAUZE/BANDAGES/DRESSINGS) ×3 IMPLANT
GLOVE BIOGEL PI IND STRL 7.0 (GLOVE) ×2 IMPLANT
GLOVE BIOGEL PI IND STRL 8.5 (GLOVE) ×1 IMPLANT
GLOVE BIOGEL PI INDICATOR 7.0 (GLOVE) ×4
GLOVE BIOGEL PI INDICATOR 8.5 (GLOVE) ×2
GLOVE ECLIPSE 6.5 STRL STRAW (GLOVE) ×3 IMPLANT
GLOVE SURG ORTHO 8.0 STRL STRW (GLOVE) ×3 IMPLANT
GOWN STRL REUS W/ TWL LRG LVL3 (GOWN DISPOSABLE) ×1 IMPLANT
GOWN STRL REUS W/TWL LRG LVL3 (GOWN DISPOSABLE) ×2
GOWN STRL REUS W/TWL XL LVL3 (GOWN DISPOSABLE) ×3 IMPLANT
NEEDLE PRECISIONGLIDE 27X1.5 (NEEDLE) ×3 IMPLANT
NS IRRIG 1000ML POUR BTL (IV SOLUTION) ×3 IMPLANT
PACK BASIN DAY SURGERY FS (CUSTOM PROCEDURE TRAY) ×3 IMPLANT
STOCKINETTE 4X48 STRL (DRAPES) ×3 IMPLANT
SUT ETHILON 4 0 PS 2 18 (SUTURE) ×6 IMPLANT
SYR BULB 3OZ (MISCELLANEOUS) ×3 IMPLANT
SYR CONTROL 10ML LL (SYRINGE) ×3 IMPLANT
TOWEL OR 17X24 6PK STRL BLUE (TOWEL DISPOSABLE) ×6 IMPLANT
UNDERPAD 30X30 (UNDERPADS AND DIAPERS) ×3 IMPLANT

## 2015-10-29 NOTE — H&P (Signed)
Joshua Khan is an 70 y.o. male.   Chief Complaint: catching right middle finger HPI: Mr. Deleeuw is a 70 year old right hand dominant male referred by Dr. Luna Glasgow. He is seen with his son. He states that he has a flexion and an inability to extend his right middle finger at the middle joint. This has been present for a year. He has had triggering of it for the same amount of time. He has a trigger finger release by Dr. Luna Glasgow two years ago. He complains of mild aching pain. He has not had any treatment for the right side. He has also had a carpal tunnel release done on his left side. He does not recall the nerve conductions were done prior to that. He is not complaining of any numbness or tingling. He has also had neck surgery done four to five years ago, but he does not remember who did this. He has a history of diabetes, arthritis. No history of thyroid problems or gout. There is a family history of diabetes. No history of thyroid problems, arthritis or gout. He is not taking any medicine for this. He states that trying to straighten his finger hurts. He is not awakened at night.  Past Medical History  Diagnosis Date  . CHI (closed head injury)  . Dementia  . Diabetes mellitus ([place])  . Diabetes mellitus type II, controlled ([place])  . GERD (gastroesophageal reflux disease)  . Hypoxia from strangulation  work related accident  . Obstructive sleep apnea    Past Medical History  Diagnosis Date  . Hypercholesterolemia   . 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 Barrett's esophagus   . S/P endoscopy August 2007    Dr. Oneida Alar: chronic active gastritis, +H.pylori, ? treatment?, short-segment Barrett's  . S/P colonoscopy     ? awaiting records?  . Sleep apnea     yes c pap  . Cancer ([place])     basal ceel removed ,  one on head and one on right arm  . Blood transfusion     as child  . GERD (gastroesophageal reflux disease)     takes  prilosec  . Arthritis     hands  . Anxiety     yes  here [place]  , no meds  . Diabetes mellitus     ,  poor circulation  . Pneumonia     3 times as baby  . Hiatal hernia   . CAD (coronary artery disease)     Nonobstructive coronary disease by cath, 2007  . Pre-syncope     May, 2015  . Ejection fraction     EF 60%, catheterization, 2007  //   EF 60%, echo, April, 2014 while in hospital  . Sinus bradycardia     Asymptomatic in 2007  . Aortic root dilatation (HCC)     Mild, echo, April, 2014  . SVT (supraventricular tachycardia) (HCC)     5 beats atrial tachycardia 48 hour Holter, May, 2014, no atrial fibrillation, no atrial flutter.  . Memory change     April, 2014, patient is scheduled for neurologic testing Jan 25, 2013  . Chronic back pain   . Lumbar radiculopathy   . Chronic neck pain   . Cervical radiculopathy     Past Surgical History  Procedure Laterality Date  . Neck surgery    . External ear surgery    . Hand surgery    . Vasectomy    .  Anal fissure repair    . Skin cancer removal    . Tonsillectomy      30's  . Esophagogastroduodenoscopy  06/29/11    barrett's esophagus/hiatal hernia/mild gastristis, s/p Savary dilation, path with no sprue, mild chronic gastritis noted, negative H.pylori, intestinal metaplasia consistent with barrett's, no dysplasia  . Colonoscopy  06/29/11    internal hemorrhoids/severe diverticulosis, path negative for microscopic colitis repeat Oct 2017 with 2 days of clear liquids and Moviprep    Family History  Problem Relation Age of Onset  . Colon cancer Neg Hx   . High blood pressure Father   . Diabetes type II Father    Social History:  reports that he has quit smoking. His smoking use included Cigarettes. He has never used smokeless tobacco. He reports that he does not drink alcohol or use illicit drugs.  Allergies:  Allergies  Allergen Reactions  . Aspirin     Cannot take if not coated, makes stomach burn  . Codeine     REACTION: UNKNOWN REACTION  . Sulfa Antibiotics     Not  sure of reaction    Medications Prior to Admission  Medication Sig Dispense Refill  . calcium carbonate (OS-CAL) 600 MG TABS tablet Take 1,000 mg by mouth daily.     . COD LIVER OIL PO Take 5 mLs by mouth daily.     . fish oil-omega-3 fatty acids 1000 MG capsule Take 2 g by mouth daily.    . Garlic 123XX123 MG CAPS Take 1,000 mg by mouth daily.      . memantine (NAMENDA) 10 MG tablet Take 1 tablet (10 mg total) by mouth 2 (two) times daily. 180 tablet 3  . metFORMIN (GLUCOPHAGE-XR) 500 MG 24 hr tablet Take 1,000 mg by mouth 2 (two) times daily.     . Methylsulfonylmethane (MSM) 1500 MG TABS Take 1,000 mg by mouth daily.     . Multiple Vitamin (MULTIVITAMIN) capsule Take 1 capsule by mouth daily.      Marland Kitchen omeprazole (PRILOSEC) 20 MG capsule Take 20 mg by mouth daily.     . QUEtiapine (SEROQUEL) 25 MG tablet Take 1 tablet (25 mg total) by mouth at bedtime. 30 tablet 11  . rivastigmine (EXELON) 4.6 mg/24hr Place 4.6 mg onto the skin daily.    . simvastatin (ZOCOR) 40 MG tablet Take 40 mg by mouth at bedtime.     Marland Kitchen UNKNOWN TO PATIENT Patch for dementia    . vitamin C (ASCORBIC ACID) 500 MG tablet Take 500 mg by mouth daily.    Marland Kitchen zinc gluconate 50 MG tablet Take 50 mg by mouth daily.        No results found for this or any previous visit (from the past 48 hour(s)).  No results found.   Pertinent items are noted in HPI.  Height 6\' 1"  (1.854 m), weight 113.399 kg (250 lb).  General appearance: alert, cooperative and appears stated age Head: Normocephalic, without obvious abnormality Neck: no JVD Resp: clear to auscultation bilaterally Cardio: regular rate and rhythm, S1, S2 normal, no murmur, click, rub or gallop GI: soft, non-tender; bowel sounds normal; no masses,  no organomegaly Extremities: trigger right middle finger with flexion contracture PIP joint Pulses: 2+ and symmetric Skin: Skin color, texture, turgor normal. No rashes or lesions Neurologic: Grossly  normal Incision/Wound: na  Assessment/Plan Assessment:  1. Trigger middle finger of right hand  2. Flexion deformity of PIP joint  of finger, right   Plan: We have  discussed the possibility of surgical intervention with him, which he would like to proceed with at this point in time. He is scheduled for release A1 pulley, right middle finger as an outpatient under regional anesthesia. We have told him we will also attempt to manipulate his PIP joint in full extension. He is advised, however, this may return to its original position. We would not recommend opening and surgically releasing the joint. We will not splint him afterwards. We will try to mobilize him as rapidly as possible. His questions are encouraged and answered. He is aware that there is no guarantee with the surgery, possibility of infection, recurrence, injury to arteries, nerves, tendons, incomplete relief of symptoms, dystrophy. He is scheduled for release A1 pulley, right middle finger with manipulation of PIP joint under anesthesia.    Trenia Tennyson R 10/29/2015, 7:47 AM

## 2015-10-29 NOTE — Op Note (Signed)
Dictation Number [id number]

## 2015-10-29 NOTE — Discharge Instructions (Addendum)

## 2015-10-29 NOTE — Op Note (Signed)
NAME:  Joshua Khan, Joshua Khan NO.:  [id number]  MEDICAL RECORD NO.:  [id number]  LOCATION:                                 FACILITY:  PHYSICIAN:  Daryll Brod, M.D.            DATE OF BIRTH:  DATE OF PROCEDURE:  10/29/2015 DATE OF DISCHARGE:                              OPERATIVE REPORT   PREOPERATIVE DIAGNOSIS:  Stenosing tenosynovitis, right middle finger with flexion contracture of proximal interphalangeal joint, right middle finger.  POSTOPERATIVE DIAGNOSIS:  Stenosing tenosynovitis, right middle finger with flexion contracture of proximal interphalangeal joint, right middle finger.  OPERATION:  Release of A1 pulley, right middle finger with manipulation of the proximal interphalangeal joint, right middle finger.  SURGEON:  Daryll Brod, MD.  ANESTHESIA:  Forearm IV regional with local infiltration.  ANESTHESIOLOGIST:  Lorrene Reid, M.D.  PLACE OF SURGERY:  [facility].  HISTORY:  The patient is a 70 year old male with a history of triggering of his right middle finger.  This has resulted in a flexion deformity of approximately 35-40 degrees of the PIP joint.  He is desirous of proceeding to have this surgically released with manipulation of the PIP joint.  Pre, peri, and postoperative course have been discussed along with risks and complications.  He is aware that there is no guarantee with the surgery, possibility of infection, recurrence of injury to arteries, nerves, tendons, incomplete relief of symptoms and dystrophy, possibility of fracture of the finger with manipulation.  In the preoperative area, the patient is seen, the extremity marked by both patient and surgeon.  Antibiotic given.  PROCEDURE IN DETAIL:  The patient was brought to the operating room, where forearm-based IV regional anesthetic was carried out without difficulty.  He was prepped using ChloraPrep, supine position with the right arm free.  A 3-minute dry time was  allowed.  Time-out taken, confirming the patient and procedure.  An oblique incision was made over the metacarpophalangeal joint right middle finger and carried down through subcutaneous tissue.  Bleeders were electrocauterized with bipolar.  Retractors placed protecting neurovascular bundles radially and ulnarly.  The A1 pulley was identified.  This was released on its radial aspect.  A small incision was made centrally in A2.  Partial tenosynovectomy was performed proximally.  Separation of the 2 tendons allowed the adhesions between the two to be separated.  The finger was passively placed through a full range of motion, no further triggering was noted.  The PIP joint was then manipulated, this was able to be brought into full extension but was moderately recalcitrant to use of extension.  This was felt to be acceptable.  The wound was then copiously irrigated with saline.  The skin was then closed with interrupted 4-0 nylon sutures.  Local infiltration with 0.25% bupivacaine without epinephrine was given, approximately 5 mL was used. A sterile compressive dressing with the fingers free was applied.  On deflation of the tourniquet, all fingers immediately pinked.  He was taken to the recovery room for observation in satisfactory condition.  He will be discharged home to return to the [facility] in 1 week on  tramadol.          ______________________________ Daryll Brod, M.D.     GK/MEDQ  D:  10/29/2015  T:  10/29/2015  Job:  [id number]

## 2015-10-29 NOTE — Transfer of Care (Signed)
Immediate Anesthesia Transfer of Care Note  Patient: Joshua Khan  Procedure(s) Performed: Procedure(s) with comments: RELEASE RIGHT MIDDLE FINGER A-1 PULLEY (Right) - ANESTHESIA: IV REGIONAL FAB  MANIPULATION PROXIMAL INTERPHALANGEAL  (Right)  Patient Location: PACU  Anesthesia Type:MAC and Bier block  Level of Consciousness: awake, alert  and patient cooperative  Airway & Oxygen Therapy: Patient Spontanous Breathing and Patient connected to face mask oxygen  Post-op Assessment: Report given to RN, Post -op Vital signs reviewed and stable and Patient moving all extremities  Post vital signs: Reviewed and stable  Last Vitals:  Filed Vitals:   10/29/15 0901 10/29/15 0915  BP: 136/72 149/75  Pulse:  58  Temp:    Resp:  17    Complications: No apparent anesthesia complications

## 2015-10-29 NOTE — Brief Op Note (Signed)
10/29/2015  9:01 AM  PATIENT:  Joshua Khan  70 y.o. male  PRE-OPERATIVE DIAGNOSIS:  TRIGGER RIGHT MIDDLE FINGER  POST-OPERATIVE DIAGNOSIS:  TRIGGER RIGHT MIDDLE FINGER  PROCEDURE:  Procedure(s) with comments: RELEASE RIGHT MIDDLE FINGER A-1 PULLEY (Right) - ANESTHESIA: IV REGIONAL FAB  MANIPULATION PROXIMAL INTERPHALANGEAL  (Right)  SURGEON:  Surgeon(s) and Role:    * Daryll Brod, MD - Primary  PHYSICIAN ASSISTANT:   ASSISTANTS: none   ANESTHESIA:   local and regional  EBL:  Total I/O In: 250 [I.V.:250] Out: -   BLOOD ADMINISTERED:none  DRAINS: none   LOCAL MEDICATIONS USED:  BUPIVICAINE   SPECIMEN:  No Specimen  DISPOSITION OF SPECIMEN:  N/A  COUNTS:  YES  TOURNIQUET:   Total Tourniquet Time Documented: Forearm (Right) - 16 minutes Total: Forearm (Right) - 16 minutes   DICTATION: .Other Dictation: Dictation Number [id number]  PLAN OF CARE: Discharge to home after PACU  PATIENT DISPOSITION:  PACU - hemodynamically stable.

## 2015-10-29 NOTE — Anesthesia Procedure Notes (Signed)
Procedure Name: MAC Date/Time: 10/29/2015 8:37 AM Performed by: Baxter Flattery Pre-anesthesia Checklist: Patient identified, Emergency Drugs available, Suction available and Patient being monitored Patient Re-evaluated:Patient Re-evaluated prior to inductionOxygen Delivery Method: Simple face mask Preoxygenation: Pre-oxygenation with 100% oxygen Intubation Type: IV induction Ventilation: Mask ventilation without difficulty

## 2015-10-29 NOTE — Anesthesia Postprocedure Evaluation (Signed)
Anesthesia Post Note  Patient: Joshua Khan  Procedure(s) Performed: Procedure(s) (LRB): RELEASE RIGHT MIDDLE FINGER A-1 PULLEY (Right) MANIPULATION PROXIMAL INTERPHALANGEAL  (Right)  Patient location during evaluation: PACU Anesthesia Type: General Level of consciousness: sedated Pain management: pain level controlled Vital Signs Assessment: post-procedure vital signs reviewed and stable Respiratory status: spontaneous breathing and respiratory function stable Cardiovascular status: stable Anesthetic complications: no    Last Vitals:  Filed Vitals:   10/29/15 0915 10/29/15 0930  BP: 149/75 140/79  Pulse: 58 51  Temp:    Resp: 17 16    Last Pain:  Filed Vitals:   10/29/15 0944  PainSc: 0-No pain                 Betrice Wanat,Oliver DANIEL

## 2015-10-29 NOTE — Anesthesia Preprocedure Evaluation (Addendum)
Anesthesia Evaluation  Patient identified by MRN, date of birth, ID band Patient awake    Reviewed: Allergy & Precautions, NPO status , Patient's Chart, lab work & pertinent test results  Airway Mallampati: I  TM Distance: >3 FB Neck ROM: Full    Dental  (+) Upper Dentures, Lower Dentures, Dental Advisory Given   Pulmonary sleep apnea and Continuous Positive Airway Pressure Ventilation , former smoker,    breath sounds clear to auscultation       Cardiovascular + CAD   Rhythm:Regular Rate:Normal     Neuro/Psych    GI/Hepatic   Endo/Other  diabetes, Well Controlled, Type 2, Oral Hypoglycemic AgentsMorbid obesity  Renal/GU      Musculoskeletal   Abdominal   Peds  Hematology   Anesthesia Other Findings Pt and wife deny any hx of heart disease  Reproductive/Obstetrics                            Anesthesia Physical Anesthesia Plan  ASA: III  Anesthesia Plan: Bier Block and MAC   Post-op Pain Management:    Induction: Intravenous  Airway Management Planned: Simple Face Mask  Additional Equipment:   Intra-op Plan:   Post-operative Plan:   Informed Consent: I have reviewed the patients History and Physical, chart, labs and discussed the procedure including the risks, benefits and alternatives for the proposed anesthesia with the patient or authorized representative who has indicated his/her understanding and acceptance.   Dental advisory given  Plan Discussed with: CRNA and Anesthesiologist  Anesthesia Plan Comments: (Pt may may keep dentures in)        Anesthesia Quick Evaluation

## 2015-10-30 ENCOUNTER — Encounter (HOSPITAL_BASED_OUTPATIENT_CLINIC_OR_DEPARTMENT_OTHER): Payer: Self-pay | Admitting: Orthopedic Surgery

## 2015-11-12 ENCOUNTER — Other Ambulatory Visit: Payer: Self-pay | Admitting: Orthopaedic Surgery

## 2015-11-12 DIAGNOSIS — M79644 Pain in right finger(s): Secondary | ICD-10-CM

## 2015-11-23 ENCOUNTER — Encounter (HOSPITAL_COMMUNITY): Payer: Self-pay | Admitting: Emergency Medicine

## 2015-11-23 ENCOUNTER — Emergency Department (HOSPITAL_COMMUNITY)
Admission: EM | Admit: 2015-11-23 | Discharge: 2015-11-23 | Disposition: A | Discharge status: Home / Self Care | Payer: Medicare Other | Attending: Dermatology | Admitting: Dermatology

## 2015-11-23 ENCOUNTER — Emergency Department (HOSPITAL_COMMUNITY): Payer: Medicare Other

## 2015-11-23 DIAGNOSIS — R0789 Other chest pain: Secondary | ICD-10-CM | POA: Diagnosis not present

## 2015-11-23 DIAGNOSIS — Z531 Procedure and treatment not carried out because of patient's decision for reasons of belief and group pressure: Secondary | ICD-10-CM | POA: Insufficient documentation

## 2015-11-23 DIAGNOSIS — Z87891 Personal history of nicotine dependence: Secondary | ICD-10-CM | POA: Insufficient documentation

## 2015-11-23 LAB — CBC WITH DIFFERENTIAL/PLATELET
BASOS ABS: 0 10*3/uL (ref 0.0–0.1)
Basophils Relative: 0 %
EOS PCT: 1 %
Eosinophils Absolute: 0.1 10*3/uL (ref 0.0–0.7)
HEMATOCRIT: 46 % (ref 39.0–52.0)
HEMOGLOBIN: 15.8 g/dL (ref 13.0–17.0)
LYMPHS ABS: 2 10*3/uL (ref 0.7–4.0)
LYMPHS PCT: 24 %
MCH: 32 pg (ref 26.0–34.0)
MCHC: 34.3 g/dL (ref 30.0–36.0)
MCV: 93.3 fL (ref 78.0–100.0)
MONO ABS: 0.8 10*3/uL (ref 0.1–1.0)
Monocytes Relative: 10 %
NEUTROS ABS: 5.4 10*3/uL (ref 1.7–7.7)
Neutrophils Relative %: 64 %
Platelets: 205 10*3/uL (ref 150–400)
RBC: 4.93 MIL/uL (ref 4.22–5.81)
RDW: 12.4 % (ref 11.5–15.5)
WBC: 8.3 10*3/uL (ref 4.0–10.5)

## 2015-11-23 LAB — COMPREHENSIVE METABOLIC PANEL
ALK PHOS: 66 U/L (ref 38–126)
ALT: 61 U/L (ref 17–63)
AST: 54 U/L — AB (ref 15–41)
Albumin: 4.4 g/dL (ref 3.5–5.0)
Anion gap: 9 (ref 5–15)
BILIRUBIN TOTAL: 0.9 mg/dL (ref 0.3–1.2)
BUN: 10 mg/dL (ref 6–20)
CALCIUM: 9.6 mg/dL (ref 8.9–10.3)
CHLORIDE: 101 mmol/L (ref 101–111)
CO2: 27 mmol/L (ref 22–32)
CREATININE: 0.84 mg/dL (ref 0.61–1.24)
Glucose, Bld: 134 mg/dL — ABNORMAL HIGH (ref 65–99)
Potassium: 4 mmol/L (ref 3.5–5.1)
Sodium: 137 mmol/L (ref 135–145)
TOTAL PROTEIN: 8 g/dL (ref 6.5–8.1)

## 2015-11-23 LAB — TROPONIN I

## 2015-11-23 NOTE — ED Notes (Signed)
PT states central chest pressure with no SOB starting x3 days ago constant. PT also states has had some sweating more than his normal. PT denies any aspirin prior to arrival.

## 2015-11-23 NOTE — ED Notes (Signed)
Pt upset about wait time.  Explained delay to patient but pt says he was leaving and going to another hospital.  Pt aware of risks of leaving.  Pt aware he has the right to a medical exam.

## 2015-12-23 ENCOUNTER — Encounter (HOSPITAL_COMMUNITY): Payer: Self-pay | Admitting: *Deleted

## 2015-12-23 ENCOUNTER — Emergency Department (HOSPITAL_COMMUNITY)
Admission: EM | Admit: 2015-12-23 | Discharge: 2015-12-23 | Disposition: A | Discharge status: Home / Self Care | Payer: Medicare Other | Attending: Emergency Medicine | Admitting: Emergency Medicine

## 2015-12-23 ENCOUNTER — Emergency Department (HOSPITAL_COMMUNITY): Payer: Medicare Other

## 2015-12-23 DIAGNOSIS — Z79899 Other long term (current) drug therapy: Secondary | ICD-10-CM | POA: Diagnosis not present

## 2015-12-23 DIAGNOSIS — Z7984 Long term (current) use of oral hypoglycemic drugs: Secondary | ICD-10-CM | POA: Insufficient documentation

## 2015-12-23 DIAGNOSIS — Z87891 Personal history of nicotine dependence: Secondary | ICD-10-CM | POA: Diagnosis not present

## 2015-12-23 DIAGNOSIS — R079 Chest pain, unspecified: Secondary | ICD-10-CM | POA: Diagnosis not present

## 2015-12-23 DIAGNOSIS — R05 Cough: Secondary | ICD-10-CM | POA: Insufficient documentation

## 2015-12-23 DIAGNOSIS — E119 Type 2 diabetes mellitus without complications: Secondary | ICD-10-CM | POA: Diagnosis not present

## 2015-12-23 DIAGNOSIS — I2510 Atherosclerotic heart disease of native coronary artery without angina pectoris: Secondary | ICD-10-CM | POA: Insufficient documentation

## 2015-12-23 DIAGNOSIS — R059 Cough, unspecified: Secondary | ICD-10-CM

## 2015-12-23 LAB — BASIC METABOLIC PANEL
ANION GAP: 10 (ref 5–15)
BUN: 8 mg/dL (ref 6–20)
CHLORIDE: 102 mmol/L (ref 101–111)
CO2: 26 mmol/L (ref 22–32)
Calcium: 9.6 mg/dL (ref 8.9–10.3)
Creatinine, Ser: 0.94 mg/dL (ref 0.61–1.24)
GFR calc Af Amer: >60 mL/min (ref >60)
GFR calc non Af Amer: >60 mL/min (ref >60)
GLUCOSE: 174 mg/dL — AB (ref 65–99)
POTASSIUM: 4.2 mmol/L (ref 3.5–5.1)
Sodium: 138 mmol/L (ref 135–145)

## 2015-12-23 LAB — CBC
HEMATOCRIT: 43 % (ref 39.0–52.0)
HEMOGLOBIN: 14.3 g/dL (ref 13.0–17.0)
MCH: 31 pg (ref 26.0–34.0)
MCHC: 33.3 g/dL (ref 30.0–36.0)
MCV: 93.1 fL (ref 78.0–100.0)
Platelets: 222 10*3/uL (ref 150–400)
RBC: 4.62 MIL/uL (ref 4.22–5.81)
RDW: 12.5 % (ref 11.5–15.5)
WBC: 9.3 10*3/uL (ref 4.0–10.5)

## 2015-12-23 LAB — TROPONIN I
Troponin I: <0.03 ng/mL (ref <0.031)
Troponin I: <0.03 ng/mL (ref <0.031)

## 2015-12-23 MED ORDER — BENZONATATE 100 MG PO CAPS: 200.0000 mg | ORAL_CAPSULE | Freq: Three times a day (TID) | ORAL | Status: DC | PRN

## 2015-12-23 MED ORDER — ACETAMINOPHEN 325 MG PO TABS
650.0000 mg | ORAL_TABLET | Freq: Once | ORAL | Status: AC
  Administered 2015-12-23: 650 mg via ORAL
  Filled 2015-12-23: qty 2

## 2015-12-23 MED ORDER — BENZONATATE 100 MG PO CAPS
200.0000 mg | ORAL_CAPSULE | Freq: Once | ORAL | Status: AC
  Administered 2015-12-23: 200 mg via ORAL
  Filled 2015-12-23: qty 2

## 2015-12-23 NOTE — ED Provider Notes (Signed)
CSN: [id number]     Arrival date & time 12/23/15  0845 History   First MD Initiated Contact with Patient 12/23/15 [phone]     Chief Complaint  Patient presents with  . Chest Pain   LEVEL 5 CAVEAT.  Pt with shortterm memory issues, poor history giver.  (Consider location/radiation/quality/duration/timing/severity/associated sxs/prior Treatment) Patient is a 70 y.o. male presenting with chest pain. The history is provided by the patient and a relative. The history is limited by the condition of the patient.  Chest Pain Pain location:  L chest Pain quality: aching   Pain radiates to:  Does not radiate Pain radiates to the back: no   Pain severity:  Mild Onset quality:  Unable to specify Timing:  Unable to specify Progression:  Improving Chronicity:  Recurrent (Was seen here on 11/23/15 for chest pain.  He does not remember if the pain was similar.  Left before eval.) Context: at rest   Context: no movement and not raising an arm   Context comment:  Son at bedside states he complained of worse pain wtih coughing, pt now denies.  Has had cold like sx this week, states had subjective fever several days ago. Relieved by:  None tried Worsened by:  Nothing tried Ineffective treatments:  None tried Associated symptoms: cough and fever   Associated symptoms: no abdominal pain, no back pain, no shortness of breath, no syncope and not vomiting   Risk factors: aortic disease, coronary artery disease, diabetes mellitus and high cholesterol   Risk factors: no prior DVT/PE and no smoking     Past Medical History  Diagnosis Date  . Hypercholesterolemia   . 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 Barrett's esophagus   . S/P endoscopy August 2007    Dr. Oneida Alar: chronic active gastritis, +H.pylori, ? treatment?, short-segment Barrett's  . S/P colonoscopy     ? awaiting records?  . Sleep apnea     yes c pap  . 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 Blood  transfusion     as child  . GERD (gastroesophageal reflux disease)     takes  prilosec  . Arthritis     hands  . Anxiety     yes here [place]  , no meds  . Diabetes mellitus     ,  poor circulation  . Pneumonia     3 times as baby  . Hiatal hernia   . CAD (coronary artery disease)     Nonobstructive coronary disease by cath, 2007  . Pre-syncope     May, 2015  . Ejection fraction     EF 60%, catheterization, 2007  //   EF 60%, echo, April, 2014 while in hospital  . Sinus bradycardia     Asymptomatic in 2007  . Aortic root dilatation (HCC)     Mild, echo, April, 2014  . SVT (supraventricular tachycardia) (HCC)     5 beats atrial tachycardia 48 hour Holter, May, 2014, no atrial fibrillation, no atrial flutter.  . Memory change     April, 2014, patient is scheduled for neurologic testing Jan 25, 2013  . Chronic back pain   . Lumbar radiculopathy   . Chronic neck pain   . Cervical radiculopathy    Past Surgical History  Procedure Laterality Date  . Neck surgery    . External ear surgery    . Hand  surgery    . Vasectomy    . Anal fissure repair    . Skin cancer removal    . Tonsillectomy      30's  . Esophagogastroduodenoscopy  06/29/11    barrett's esophagus/hiatal hernia/mild gastristis, s/p Savary dilation, path with no sprue, mild chronic gastritis noted, negative H.pylori, intestinal metaplasia consistent with barrett's, no dysplasia  . Colonoscopy  06/29/11    internal hemorrhoids/severe diverticulosis, path negative for microscopic colitis repeat Oct 2017 with 2 days of clear liquids and Moviprep  . Trigger finger release Right 10/29/2015    Procedure: RELEASE RIGHT MIDDLE FINGER A-1 PULLEY, MANIPULATION PROXIMAL IPJ;  Surgeon: Daryll Brod, MD;  Location: [facility];  Service: Orthopedics;  Laterality: Right;  ANESTHESIA: IV REGIONAL FAB    Family History  Problem Relation Age of Onset  . Colon cancer Neg Hx   . High blood pressure Father   . Diabetes  type II Father    Social History  Substance Use Topics  . Smoking status: Former Smoker    Types: Cigarettes  . Smokeless tobacco: Never Used     Comment: smoked age [age]  . Alcohol Use: No     Comment: Quit 70 years old    Review of Systems  Constitutional: Positive for fever.  Respiratory: Positive for cough and chest tightness. Negative for shortness of breath.   Cardiovascular: Positive for chest pain. Negative for syncope.  Gastrointestinal: Negative for vomiting and abdominal pain.  Musculoskeletal: Negative for back pain.  Skin: Negative.       Allergies  Aspirin; Codeine; Sulfa antibiotics; and Tomato  Home Medications   Prior to Admission medications   Medication Sig Start Date End Date Taking? Authorizing Provider  calcium carbonate (OS-CAL) 600 MG TABS tablet Take 1,000 mg by mouth daily.    Yes Historical Provider, MD  COD LIVER OIL PO Take 5 mLs by mouth daily.    Yes Historical Provider, MD  donepezil (ARICEPT) 10 MG tablet Take 10 mg by mouth at bedtime. 12/11/15  Yes Historical Provider, MD  fish oil-omega-3 fatty acids 1000 MG capsule Take 2 g by mouth daily.   Yes Historical Provider, MD  Garlic [id number] MG CAPS Take 1,000 mg by mouth daily.     Yes Historical Provider, MD  metFORMIN (GLUCOPHAGE-XR) 500 MG 24 hr tablet Take 1,000 mg by mouth 2 (two) times daily.    Yes Historical Provider, MD  Methylsulfonylmethane (MSM) 1500 MG TABS Take 1,000 mg by mouth daily.    Yes Historical Provider, MD  Multiple Vitamin (MULTIVITAMIN) capsule Take 1 capsule by mouth daily.     Yes Historical Provider, MD  omeprazole (PRILOSEC) 20 MG capsule Take 20 mg by mouth daily.    Yes Historical Provider, MD  QUEtiapine (SEROQUEL) 25 MG tablet Take 1 tablet (25 mg total) by mouth at bedtime. 10/01/15  Yes Marcial Pacas, MD  rivastigmine (EXELON) 9.5 mg/24hr Place 9.5 mg onto the skin daily.   Yes Historical Provider, MD  simvastatin (ZOCOR) 40 MG tablet Take 40 mg by mouth at bedtime.   06/14/11  Yes Historical Provider, MD  vitamin C (ASCORBIC ACID) 500 MG tablet Take 500 mg by mouth daily.   Yes Historical Provider, MD  zinc gluconate 50 MG tablet Take 50 mg by mouth daily.     Yes Historical Provider, MD  benzonatate (TESSALON) 100 MG capsule Take 2 capsules (200 mg total) by mouth 3 (three) times daily as needed for cough. 12/23/15  Evalee Jefferson, PA-C  memantine (NAMENDA) 10 MG tablet Take 1 tablet (10 mg total) by mouth 2 (two) times daily. Patient not taking: Reported on 12/23/2015 10/01/15   Marcial Pacas, MD  traMADol (ULTRAM) 50 MG tablet Take 1 tablet (50 mg total) by mouth every 6 (six) hours as needed. Patient not taking: Reported on 12/23/2015 10/29/15   Daryll Brod, MD   BP 102/74 mmHg  Pulse 57  Temp(Src) 97.5 F (36.4 C) (Oral)  Resp 16  Ht 6' (1.829 m)  Wt 113.399 kg  BMI 33.90 kg/m2  SpO2 99% Physical Exam  Constitutional: He appears well-developed and well-nourished.  HENT:  Head: Normocephalic and atraumatic.  Eyes: Conjunctivae are normal.  Neck: Normal range of motion.  Cardiovascular: Normal rate, regular rhythm, normal heart sounds and intact distal pulses.   Pulmonary/Chest: Effort normal and breath sounds normal. He has no wheezes. He exhibits no tenderness.  Abdominal: Soft. Bowel sounds are normal. There is no tenderness. There is no guarding.  Musculoskeletal: Normal range of motion.  Neurological: He is alert.  Skin: Skin is warm and dry.  Psychiatric: He has a normal mood and affect.  Nursing note and vitals reviewed.   ED Course  Procedures (including critical care time) Labs Review Labs Reviewed  BASIC METABOLIC PANEL - Abnormal; Notable for the following:    Glucose, Bld 174 (*)    All other components within normal limits  CBC  TROPONIN I  TROPONIN I    Imaging Review Dg Chest 2 View  12/23/2015  CLINICAL DATA:  Nonproductive cough, left-sided chest pain and pressure for 2 days. EXAM: CHEST  2 VIEW COMPARISON:  11/23/2015.  FINDINGS: Trachea is midline. Heart size stable. Biapical pleural parenchymal scarring. Lungs are otherwise clear. No pleural fluid. Old right first rib fracture. IMPRESSION: No acute findings. Electronically Signed   By: Lorin Picket M.D.   On: 12/23/2015 09:47   I have personally reviewed and evaluated these images and lab results as part of my medical decision-making.   EKG Interpretation   Date/Time:  Wednesday December 23 2015 08:53:28 EDT Ventricular Rate:  58 PR Interval:  177 QRS Duration: 93 QT Interval:  399 QTC Calculation: 392 R Axis:   25 Text Interpretation:  Sinus rhythm Baseline wander in lead(s) III aVL  Similar to previous Confirmed by ZAVITZ  MD, JOSHUA ([id number]) on 12/23/2015  8:56:25 AM      MDM   Final diagnoses:  Chest pain, unspecified chest pain type  Cough    Cardiac cath reports from May 2014:  Diffuse coronary ectasia with nonobstructive stenosis primarily in the LAD and right coronary artery.  (Stenosis 30-40%) 2. Normal left ventricular systolic function   Pt with normal labs today including normal delta trops.  Pt comfortable during ed visit.  Risk factors for CAD but exam and history not suggestive of unstable angina.  Advised f/u with pcp for consideration of stress testing. He was given tessalon given recent uri and persistent cough.   Pt seen by Dr. Reather Converse during todays visit.   Evalee Jefferson, PA-C 12/23/15 1256  Elnora Morrison, MD 12/26/15 [phone]

## 2015-12-23 NOTE — ED Notes (Signed)
Patient with no complaints at this time. Respirations even and unlabored. Skin warm/dry. Discharge instructions reviewed with patient at this time. Patient given opportunity to voice concerns/ask questions. IV removed per policy and band-aid applied to site. Patient discharged at this time and left Emergency Department with steady gait.  

## 2015-12-23 NOTE — Discharge Instructions (Signed)
Nonspecific Chest Pain  °Chest pain can be caused by many different conditions. There is always a chance that your pain could be related to something serious, such as a heart attack or a blood clot in your lungs. Chest pain can also be caused by conditions that are not life-threatening. If you have chest pain, it is very important to follow up with your health care provider. °CAUSES  °Chest pain can be caused by: °· Heartburn. °· Pneumonia or bronchitis. °· Anxiety or stress. °· Inflammation around your heart (pericarditis) or lung (pleuritis or pleurisy). °· A blood clot in your lung. °· A collapsed lung (pneumothorax). It can develop suddenly on its own (spontaneous pneumothorax) or from trauma to the chest. °· Shingles infection (varicella-zoster virus). °· Heart attack. °· Damage to the bones, muscles, and cartilage that make up your chest wall. This can include: °¨ Bruised bones due to injury. °¨ Strained muscles or cartilage due to frequent or repeated coughing or overwork. °¨ Fracture to one or more ribs. °¨ Sore cartilage due to inflammation (costochondritis). °RISK FACTORS  °Risk factors for chest pain may include: °· Activities that increase your risk for trauma or injury to your chest. °· Respiratory infections or conditions that cause frequent coughing. °· Medical conditions or overeating that can cause heartburn. °· Heart disease or family history of heart disease. °· Conditions or health behaviors that increase your risk of developing a blood clot. °· Having had chicken pox (varicella zoster). °SIGNS AND SYMPTOMS °Chest pain can feel like: °· Burning or tingling on the surface of your chest or deep in your chest. °· Crushing, pressure, aching, or squeezing pain. °· Dull or sharp pain that is worse when you move, cough, or take a deep breath. °· Pain that is also felt in your back, neck, shoulder, or arm, or pain that spreads to any of these areas. °Your chest pain may come and go, or it may stay  constant. °DIAGNOSIS °Lab tests or other studies may be needed to find the cause of your pain. Your health care provider may have you take a test called an ambulatory ECG (electrocardiogram). An ECG records your heartbeat patterns at the time the test is performed. You may also have other tests, such as: °· Transthoracic echocardiogram (TTE). During echocardiography, sound waves are used to create a picture of all of the heart structures and to look at how blood flows through your heart. °· Transesophageal echocardiogram (TEE). This is a more advanced imaging test that obtains images from inside your body. It allows your health care provider to see your heart in finer detail. °· Cardiac monitoring. This allows your health care provider to monitor your heart rate and rhythm in real time. °· Holter monitor. This is a portable device that records your heartbeat and can help to diagnose abnormal heartbeats. It allows your health care provider to track your heart activity for several days, if needed. °· Stress tests. These can be done through exercise or by taking medicine that makes your heart beat more quickly. °· Blood tests. °· Imaging tests. °TREATMENT  °Your treatment depends on what is causing your chest pain. Treatment may include: °· Medicines. These may include: °¨ Acid blockers for heartburn. °¨ Anti-inflammatory medicine. °¨ Pain medicine for inflammatory conditions. °¨ Antibiotic medicine, if an infection is present. °¨ Medicines to dissolve blood clots. °¨ Medicines to treat coronary artery disease. °· Supportive care for conditions that do not require medicines. This may include: °¨ Resting. °¨ Applying heat   or cold packs to injured areas. °¨ Limiting activities until pain decreases. °HOME CARE INSTRUCTIONS °· If you were prescribed an antibiotic medicine, finish it all even if you start to feel better. °· Avoid any activities that bring on chest pain. °· Do not use any tobacco products, including  cigarettes, chewing tobacco, or electronic cigarettes. If you need help quitting, ask your health care provider. °· Do not drink alcohol. °· Take medicines only as directed by your health care provider. °· Keep all follow-up visits as directed by your health care provider. This is important. This includes any further testing if your chest pain does not go away. °· If heartburn is the cause for your chest pain, you may be told to keep your head raised (elevated) while sleeping. This reduces the chance that acid will go from your stomach into your esophagus. °· Make lifestyle changes as directed by your health care provider. These may include: °¨ Getting regular exercise. Ask your health care provider to suggest some activities that are safe for you. °¨ Eating a heart-healthy diet. A registered dietitian can help you to learn healthy eating options. °¨ Maintaining a healthy weight. °¨ Managing diabetes, if necessary. °¨ Reducing stress. °SEEK MEDICAL CARE IF: °· Your chest pain does not go away after treatment. °· You have a rash with blisters on your chest. °· You have a fever. °SEEK IMMEDIATE MEDICAL CARE IF:  °· Your chest pain is worse. °· You have an increasing cough, or you cough up blood. °· You have severe abdominal pain. °· You have severe weakness. °· You faint. °· You have chills. °· You have sudden, unexplained chest discomfort. °· You have sudden, unexplained discomfort in your arms, back, neck, or jaw. °· You have shortness of breath at any time. °· You suddenly start to sweat, or your skin gets clammy. °· You feel nauseous or you vomit. °· You suddenly feel light-headed or dizzy. °· Your heart begins to beat quickly, or it feels like it is skipping beats. °These symptoms may represent a serious problem that is an emergency. Do not wait to see if the symptoms will go away. Get medical help right away. Call your local emergency services (911 in the [place] Do not drive yourself to the hospital. °  °This  information is not intended to replace advice given to you by your health care provider. Make sure you discuss any questions you have with your health care provider. °  °Document Released: 05/25/2005 Document Revised: 09/05/2014 Document Reviewed: 03/21/2014 °Elsevier Interactive Patient Education ©2016 [employer] ° °

## 2015-12-23 NOTE — ED Notes (Signed)
Julie, PA at bedside.

## 2015-12-23 NOTE — ED Notes (Signed)
Patient reports left sided Chest pain that started last night. Denies radiation. States he has a cold and cough and reports pain is in his "left lung". Non productive cough.

## 2016-03-11 ENCOUNTER — Telehealth: Payer: Self-pay | Admitting: *Deleted

## 2016-03-11 NOTE — Telephone Encounter (Signed)
Pt medical records [facility] on 03/11/16.

## 2016-04-27 ENCOUNTER — Encounter: Payer: Self-pay | Admitting: Gastroenterology

## 2016-06-16 ENCOUNTER — Other Ambulatory Visit: Payer: Self-pay | Admitting: Neurology

## 2016-08-12 ENCOUNTER — Emergency Department (HOSPITAL_COMMUNITY)
Admission: EM | Admit: 2016-08-12 | Discharge: 2016-08-12 | Disposition: A | Discharge status: Home / Self Care | Payer: Medicare Other | Attending: Emergency Medicine | Admitting: Emergency Medicine

## 2016-08-12 ENCOUNTER — Emergency Department (HOSPITAL_COMMUNITY): Payer: Medicare Other

## 2016-08-12 ENCOUNTER — Encounter (HOSPITAL_COMMUNITY): Payer: Self-pay | Admitting: Emergency Medicine

## 2016-08-12 DIAGNOSIS — E119 Type 2 diabetes mellitus without complications: Secondary | ICD-10-CM | POA: Diagnosis not present

## 2016-08-12 DIAGNOSIS — Z7984 Long term (current) use of oral hypoglycemic drugs: Secondary | ICD-10-CM | POA: Insufficient documentation

## 2016-08-12 DIAGNOSIS — R079 Chest pain, unspecified: Secondary | ICD-10-CM | POA: Diagnosis present

## 2016-08-12 DIAGNOSIS — Z79899 Other long term (current) drug therapy: Secondary | ICD-10-CM | POA: Insufficient documentation

## 2016-08-12 DIAGNOSIS — Z87891 Personal history of nicotine dependence: Secondary | ICD-10-CM | POA: Insufficient documentation

## 2016-08-12 DIAGNOSIS — I2510 Atherosclerotic heart disease of native coronary artery without angina pectoris: Secondary | ICD-10-CM | POA: Insufficient documentation

## 2016-08-12 LAB — CBC
HCT: 45.2 % (ref 39.0–52.0)
Hemoglobin: 15.4 g/dL (ref 13.0–17.0)
MCH: 31.8 pg (ref 26.0–34.0)
MCHC: 34.1 g/dL (ref 30.0–36.0)
MCV: 93.4 fL (ref 78.0–100.0)
PLATELETS: 183 10*3/uL (ref 150–400)
RBC: 4.84 MIL/uL (ref 4.22–5.81)
RDW: 13.4 % (ref 11.5–15.5)
WBC: 7.7 10*3/uL (ref 4.0–10.5)

## 2016-08-12 LAB — URINALYSIS, ROUTINE W REFLEX MICROSCOPIC
BACTERIA UA: NONE SEEN
Bilirubin Urine: NEGATIVE
Hgb urine dipstick: NEGATIVE
KETONES UR: NEGATIVE mg/dL
Leukocytes, UA: NEGATIVE
NITRITE: NEGATIVE
PH: 5 (ref 5.0–8.0)
Protein, ur: NEGATIVE mg/dL
RBC / HPF: NONE SEEN RBC/hpf (ref 0–5)
Specific Gravity, Urine: 1.007 (ref 1.005–1.030)

## 2016-08-12 LAB — BASIC METABOLIC PANEL
Anion gap: 10 (ref 5–15)
BUN: 8 mg/dL (ref 6–20)
CALCIUM: 9.5 mg/dL (ref 8.9–10.3)
CO2: 25 mmol/L (ref 22–32)
CREATININE: 0.96 mg/dL (ref 0.61–1.24)
Chloride: 101 mmol/L (ref 101–111)
GFR calc Af Amer: >60 mL/min (ref >60)
GLUCOSE: 169 mg/dL — AB (ref 65–99)
Potassium: 3.8 mmol/L (ref 3.5–5.1)
Sodium: 136 mmol/L (ref 135–145)

## 2016-08-12 LAB — TROPONIN I

## 2016-08-12 NOTE — ED Triage Notes (Signed)
Having chest pain for about one month on and off.  C/o burning from left chest with pressure with pain moving to right chest.  C/o slight pressure to left chest area rating pain 3/10.  SOB when pain comes and chest burns.  Also makes throat dry.

## 2016-08-12 NOTE — ED Provider Notes (Signed)
[facility] DEPT Provider Note   CSN: [id number] Arrival date & time: 08/12/16  1123     History   Chief Complaint Chief Complaint  Patient presents with  . Chest Pain    HPI Joshua Khan is a 70 y.o. male.  HPI  The patient is a 70 year old male with a history of dementia, history of diabetes, hypercholesterolemia, history of a nonobstructive coronary disease by a heart catheterization done in 2007 as well as a normal echocardiogram done in April 2014. He does have mild aortic root dilation that was noticed on his echocardiogram at that time. The patient is not a very good historian and states that he has a poor memory but does report that over the last month he feels like he is having intermittent symptoms where he gets a burning sensation in his chest, it goes all the way down to his lower abdomen and feels like it burns when he urinates. He also has a feeling of shoulder pain on the left which sometimes goes to the right side and sometimes she gets short of breath with it. He had this occur a couple of nights ago, it resolved spontaneously, it has occurred again today pre-hospital and lasted approximately 30 minutes and now has completely resolved. He states that he is symptom-free, he does not have exertional symptoms, does not have swelling of his legs, does not have coughing shortness of breath nausea vomiting fevers chills or abdominal pain or back pain. He does not recall the last time he had any stress test but does follow with the cardiologist though he cannot remember the person's name.  Past Medical History:  Diagnosis Date  . Anxiety    yes here latley  , no meds  . Aortic root dilatation (HCC)    Mild, echo, April, 2014  . Arthritis    hands  . Barrett's esophagus   . Blood transfusion    as child  . CAD (coronary artery disease)    Nonobstructive coronary disease by cath, 2007  . Cancer ([facility])    basal ceel removed ,  one on head and one on right arm  .  Cervical radiculopathy   . Chronic back pain   . Chronic neck pain   . Diabetes mellitus    ,  poor circulation  . Ejection fraction    EF 60%, catheterization, 2007  //   EF 60%, echo, April, 2014 while in hospital  . GERD (gastroesophageal reflux disease)    takes  prilosec  . Hiatal hernia   . Hypercholesterolemia   . Lumbar radiculopathy   . Memory change    April, 2014, patient is scheduled for neurologic testing Jan 25, 2013  . Pneumonia    3 times as baby  . Poor short term memory    on Aricept  . Pre-syncope    May, 2015  . S/P colonoscopy    ? awaiting records?  . S/P endoscopy August 2007   Dr. Oneida Alar: chronic active gastritis, +H.pylori, ? treatment?, short-segment Barrett's  . Sinus bradycardia    Asymptomatic in 2007  . Sleep apnea    yes c pap  . SVT (supraventricular tachycardia) (HCC)    5 beats atrial tachycardia 48 hour Holter, May, 2014, no atrial fibrillation, no atrial flutter.    Patient Active Problem List   Diagnosis Date Noted  . Seizures ([facility]) 01/25/2013  . SVT (supraventricular tachycardia) ([facility])   . Dementia   . CAD (coronary artery disease)   .  Pre-syncope   . Sinus bradycardia   . Ejection fraction   . Anxiety   . Aortic root dilatation ([facility])   . Loose stools 06/21/2011  . 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 DM 05/19/2009  . HYPERLIPIDEMIA 05/19/2009    Past Surgical History:  Procedure Laterality Date  . ANAL FISSURE REPAIR    . COLONOSCOPY  06/29/11   internal hemorrhoids/severe diverticulosis, path negative for microscopic colitis repeat Oct 2017 with 2 days of clear liquids and Moviprep  . ESOPHAGOGASTRODUODENOSCOPY  06/29/11   barrett's esophagus/hiatal hernia/mild gastristis, s/p Savary dilation, path with no sprue, mild chronic gastritis noted, negative H.pylori, intestinal metaplasia consistent with barrett's, no dysplasia  . EXTERNAL EAR SURGERY    . HAND SURGERY    . NECK SURGERY    . skin cancer removal    .  TONSILLECTOMY     30's  . TRIGGER FINGER RELEASE Right 10/29/2015   Procedure: RELEASE RIGHT MIDDLE FINGER A-1 PULLEY, MANIPULATION PROXIMAL IPJ;  Surgeon: Daryll Brod, MD;  Location: [facility];  Service: Orthopedics;  Laterality: Right;  ANESTHESIA: IV REGIONAL FAB   . VASECTOMY         Home Medications    Prior to Admission medications   Medication Sig Start Date End Date Taking? Authorizing Provider  calcium carbonate (OS-CAL) 600 MG TABS tablet Take 1,000 mg by mouth daily.    Yes Historical Provider, MD  COD LIVER OIL PO Take 5 mLs by mouth daily.    Yes Historical Provider, MD  fish oil-omega-3 fatty acids 1000 MG capsule Take 2 g by mouth daily.   Yes Historical Provider, MD  Garlic 123XX123 MG CAPS Take 1,000 mg by mouth daily.     Yes Historical Provider, MD  memantine (NAMENDA) 5 MG tablet Take 5 mg by mouth 2 (two) times daily.   Yes Historical Provider, MD  metFORMIN (GLUCOPHAGE-XR) 500 MG 24 hr tablet Take 500 mg by mouth 2 (two) times daily.    Yes Historical Provider, MD  Multiple Vitamin (MULTIVITAMIN) capsule Take 1 capsule by mouth daily.     Yes Historical Provider, MD  omeprazole (PRILOSEC) 20 MG capsule Take 20 mg by mouth daily.    Yes Historical Provider, MD  rivastigmine (EXELON) 9.5 mg/24hr Place 9.5 mg onto the skin daily.   Yes Historical Provider, MD  simvastatin (ZOCOR) 40 MG tablet Take 40 mg by mouth at bedtime.  06/14/11  Yes Historical Provider, MD  vitamin C (ASCORBIC ACID) 500 MG tablet Take 500 mg by mouth daily.   Yes Historical Provider, MD  zinc gluconate 50 MG tablet Take 50 mg by mouth daily.     Yes Historical Provider, MD    Family History Family History  Problem Relation Age of Onset  . High blood pressure Father   . Diabetes type II Father   . Colon cancer Neg Hx     Social History Social History  Substance Use Topics  . Smoking status: Former Smoker    Types: Cigarettes  . Smokeless tobacco: Never Used     Comment:  smoked age [age]  . Alcohol use No     Comment: Quit 70 years old     Allergies   Aspirin; Codeine; Sulfa antibiotics; and Tomato   Review of Systems Review of Systems  All other systems reviewed and are negative.    Physical Exam Updated Vital Signs BP 172/91 (BP Location: Right Arm)   Pulse 63   Temp 97.9 F (36.6  C) (Oral)   Resp 15   Ht 6\' 5"  (1.956 m)   Wt 250 lb (113.4 kg)   SpO2 97%   BMI 29.65 kg/m   Physical Exam  Constitutional: He appears well-developed and well-nourished. No distress.  HENT:  Head: Normocephalic and atraumatic.  Mouth/Throat: Oropharynx is clear and moist. No oropharyngeal exudate.  Eyes: Conjunctivae and EOM are normal. Pupils are equal, round, and reactive to light. Right eye exhibits no discharge. Left eye exhibits no discharge. No scleral icterus.  Neck: Normal range of motion. Neck supple. No JVD present. No thyromegaly present.  Cardiovascular: Normal rate, regular rhythm, normal heart sounds and intact distal pulses.  Exam reveals no gallop and no friction rub.   No murmur heard. Pulmonary/Chest: Effort normal and breath sounds normal. No respiratory distress. He has no wheezes. He has no rales.  Abdominal: Soft. Bowel sounds are normal. He exhibits no distension and no mass. There is no tenderness.  Musculoskeletal: Normal range of motion. He exhibits no edema or tenderness.  Lymphadenopathy:    He has no cervical adenopathy.  Neurological: He is alert. Coordination normal.  Memory is impaired, he is able to speak in full sentences and follow commands without difficulty but cannot remember events prior to 45 minutes ago  Skin: Skin is warm and dry. No rash noted. No erythema.  Psychiatric: He has a normal mood and affect. His behavior is normal.  Nursing note and vitals reviewed.    ED Treatments / Results  Labs (all labs ordered are listed, but only abnormal results are displayed) Labs Reviewed  BASIC METABOLIC PANEL -  Abnormal; Notable for the following:       Result Value   Glucose, Bld 169 (*)    All other components within normal limits  URINALYSIS, ROUTINE W REFLEX MICROSCOPIC - Abnormal; Notable for the following:    Glucose, UA >=500 (*)    All other components within normal limits  TROPONIN I  CBC    EKG  EKG Interpretation  Date/Time:  Friday August 12 2016 11:45:21 EST Ventricular Rate:  72 PR Interval:  168 QRS Duration: 82 QT Interval:  366 QTC Calculation: 400 R Axis:   -33 Text Interpretation:  Normal sinus rhythm Left axis deviation Abnormal ECG since last tracing no significant change Confirmed by Sabra Heck  MD, Zaylon Bossier ([id number]) on 08/12/2016 12:15:50 PM       Radiology Dg Chest 2 View  Result Date: 08/12/2016 CLINICAL DATA:  Increasing chest pain for 1 month, initial encounter EXAM: CHEST  2 VIEW COMPARISON:  06/25/2016 FINDINGS: Cardiac shadow is within normal limits. The lungs are well aerated bilaterally. Healed first rib fracture is noted on the right. No acute bony abnormality is seen. IMPRESSION: No acute abnormality noted. Electronically Signed   By: Inez Catalina M.D.   On: 08/12/2016 12:12    Procedures Procedures (including critical care time)  Medications Ordered in ED Medications - No data to display   Initial Impression / Assessment and Plan / ED Course  I have reviewed the triage vital signs and the nursing notes.  Pertinent labs & imaging results that were available during my care of the patient were reviewed by me and considered in my medical decision making (see chart for details).  Clinical Course     At this time the patient appears well, there is no focal findings to suggest a specific etiology of his symptoms, his EKG is unremarkable and unchanged and his chest x-ray is also unremarkable.  We'll obtain some blood work to make sure he is not had a myocardial infarction but at this time the patient likely needs to follow-up with cardiology in the  outpatient setting. His last heart catheterization was 10 years ago but was nonobstructive. We'll discuss with cardiology prior to determining disposition  I discussed the results with the patient and his significant other, the patient refuses to have another stress test, states that he feels fine and does not want anything else done. His wife states that she cannot force him to do anything, at this time he has negative labs, negative EKG, negative x-ray, no obvious findings of any definitive answer and has a negative urinalysis other than mild glucosuria. He appears stable for discharge however I have cautioned them both that he needs a stress test, they have expressed her understanding.  Final Clinical Impressions(s) / ED Diagnoses   Final diagnoses:  Nonspecific chest pain    New Prescriptions New Prescriptions   No medications on file     Noemi Chapel, MD 08/12/16 1422

## 2016-08-12 NOTE — Discharge Instructions (Signed)
Please obtain all of your results from medical records or have your doctors office obtain the results - share them with your doctor - you should be seen at your doctors office in the next 2 days. Call today to arrange your follow up. Take the medications as prescribed. Please review all of the medicines and only take them if you do not have an allergy to them. Please be aware that if you are taking birth control pills, taking other prescriptions, ESPECIALLY ANTIBIOTICS may make the birth control ineffective - if this is the case, either do not engage in sexual activity or use alternative methods of birth control such as condoms until you have finished the medicine and your family doctor says it is OK to restart them. If you are on a blood thinner such as COUMADIN, be aware that any other medicine that you take may cause the coumadin to either work too much, or not enough - you should have your coumadin level rechecked in next 7 days if this is the case.  °?  °It is also a possibility that you have an allergic reaction to any of the medicines that you have been prescribed - Everybody reacts differently to medications and while MOST people have no trouble with most medicines, you may have a reaction such as nausea, vomiting, rash, swelling, shortness of breath. If this is the case, please stop taking the medicine immediately and contact your physician.  °?  °You should return to the ER if you develop severe or worsening symptoms.  ° °[facility] ° ° ° °Edward Hawkins MD. Specialty: Pulmonary Disease Contact information: [address]  °[address]  °[place] [address]  °[phone]  ° °Margaret Simpson, MD. Specialty: Family Medicine Contact information: [address]  °[place] [address]  °[phone]  ° °Scott Luking, MD. Specialty: Family Medicine Contact information: [address]  °[address]  °[place] [address]  °[phone]  ° °Tesfaye Fanta, MD Specialty:  Internal Medicine Contact information: [address]  °[place] [address]  °[phone]  ° °Zach Hall, MD. Specialty: Internal Medicine Contact information: [address]  °[place] [address]  °[phone]  ° °Angus Mcinnis, MD. Specialty: Family Medicine Contact information: [address]  °[place] [address]  °[phone]  ° °Stephen Knowlton, MD. Specialty: Family Medicine Contact information: [address]  °[address]  °[place] [address]  °[phone]  ° °Roy Fagan, MD. Specialty: Internal Medicine Contact information: [address]  °[address]  °[place] [address]  °[phone]  ° °

## 2016-11-24 ENCOUNTER — Emergency Department (HOSPITAL_COMMUNITY): Payer: Medicare Other

## 2016-11-24 ENCOUNTER — Encounter (HOSPITAL_COMMUNITY): Payer: Self-pay

## 2016-11-24 ENCOUNTER — Emergency Department (HOSPITAL_COMMUNITY)
Admission: EM | Admit: 2016-11-24 | Discharge: 2016-11-24 | Discharge status: Against Medical Advice | Payer: Medicare Other | Attending: Emergency Medicine | Admitting: Emergency Medicine

## 2016-11-24 DIAGNOSIS — Z79899 Other long term (current) drug therapy: Secondary | ICD-10-CM | POA: Diagnosis not present

## 2016-11-24 DIAGNOSIS — R748 Abnormal levels of other serum enzymes: Secondary | ICD-10-CM

## 2016-11-24 DIAGNOSIS — I2510 Atherosclerotic heart disease of native coronary artery without angina pectoris: Secondary | ICD-10-CM | POA: Diagnosis not present

## 2016-11-24 DIAGNOSIS — R945 Abnormal results of liver function studies: Secondary | ICD-10-CM | POA: Insufficient documentation

## 2016-11-24 DIAGNOSIS — Z87891 Personal history of nicotine dependence: Secondary | ICD-10-CM | POA: Insufficient documentation

## 2016-11-24 DIAGNOSIS — Z7984 Long term (current) use of oral hypoglycemic drugs: Secondary | ICD-10-CM | POA: Insufficient documentation

## 2016-11-24 DIAGNOSIS — E1165 Type 2 diabetes mellitus with hyperglycemia: Secondary | ICD-10-CM | POA: Insufficient documentation

## 2016-11-24 DIAGNOSIS — R079 Chest pain, unspecified: Secondary | ICD-10-CM

## 2016-11-24 DIAGNOSIS — R739 Hyperglycemia, unspecified: Secondary | ICD-10-CM

## 2016-11-24 LAB — COMPREHENSIVE METABOLIC PANEL
ALT: 133 U/L — ABNORMAL HIGH (ref 17–63)
ANION GAP: 9 (ref 5–15)
AST: 117 U/L — ABNORMAL HIGH (ref 15–41)
Albumin: 4.2 g/dL (ref 3.5–5.0)
Alkaline Phosphatase: 104 U/L (ref 38–126)
BUN: 6 mg/dL (ref 6–20)
CHLORIDE: 97 mmol/L — AB (ref 101–111)
CO2: 28 mmol/L (ref 22–32)
Calcium: 9.5 mg/dL (ref 8.9–10.3)
Creatinine, Ser: 0.88 mg/dL (ref 0.61–1.24)
GFR calc Af Amer: >60 mL/min (ref >60)
Glucose, Bld: 235 mg/dL — ABNORMAL HIGH (ref 65–99)
POTASSIUM: 3.8 mmol/L (ref 3.5–5.1)
Sodium: 134 mmol/L — ABNORMAL LOW (ref 135–145)
Total Bilirubin: 0.8 mg/dL (ref 0.3–1.2)
Total Protein: 8.1 g/dL (ref 6.5–8.1)

## 2016-11-24 LAB — CBC
HEMATOCRIT: 44.3 % (ref 39.0–52.0)
Hemoglobin: 15.2 g/dL (ref 13.0–17.0)
MCH: 31.7 pg (ref 26.0–34.0)
MCHC: 34.3 g/dL (ref 30.0–36.0)
MCV: 92.3 fL (ref 78.0–100.0)
Platelets: 204 10*3/uL (ref 150–400)
RBC: 4.8 MIL/uL (ref 4.22–5.81)
RDW: 12.6 % (ref 11.5–15.5)
WBC: 8.1 10*3/uL (ref 4.0–10.5)

## 2016-11-24 LAB — TROPONIN I: Troponin I: <0.03 ng/mL (ref <0.03)

## 2016-11-24 LAB — LIPASE, BLOOD: LIPASE: 38 U/L (ref 11–51)

## 2016-11-24 NOTE — ED Provider Notes (Signed)
[facility] DEPT Provider Note   CSN: [id number] Arrival date & time: 11/24/16  0706     History   Chief Complaint Chief Complaint  Patient presents with  . Abdominal Pain    HPI Joshua Khan is a 71 y.o. male.  Level V caveat for mild dementia. Patient presents with burning in his chest starting yesterday.  No diaphoresis, nausea, dyspnea. He does not have a primary care doctor. His wife says he is resistant to medical treatment. His past medical history is well-documented in the chart. Nursing notes report upper abdominal pain, the patient does not seem to complain of that concern at this time.      Past Medical History:  Diagnosis Date  . Anxiety    yes here lat[place]  , no meds  . Aortic root dilatation (HCC)    Mild, echo, April, 2014  . Arthritis    hands  . Barrett's esophagus   . Blood transfusion    as child  . CAD (coronary artery disease)    Nonobstructive coronary disease by cath, 2007  . Cancer ([facility])    basal ceel removed ,  one on head and one on right arm  . Cervical radiculopathy   . Chronic back pain   . Chronic neck pain   . Diabetes mellitus    ,  poor circulation  . Ejection fraction    EF 60%, catheterization, 2007  //   EF 60%, echo, April, 2014 while in hospital  . GERD (gastroesophageal reflux disease)    takes  prilosec  . Hiatal hernia   . Hypercholesterolemia   . Lumbar radiculopathy   . Memory change    April, 2014, patient is scheduled for neurologic testing Jan 25, 2013  . Pneumonia    3 times as baby  . Poor short term memory    on Aricept  . Pre-syncope    May, 2015  . S/P colonoscopy    ? awaiting records?  . S/P endoscopy August 2007   Dr. Oneida Alar: chronic active gastritis, +H.pylori, ? treatment?, short-segment Barrett's  . Sinus bradycardia    Asymptomatic in 2007  . Sleep apnea    yes c pap  . SVT (supraventricular tachycardia) (HCC)    5 beats atrial tachycardia 48 hour Holter, May, 2014, no atrial fibrillation,  no atrial flutter.    Patient Active Problem List   Diagnosis Date Noted  . Seizures ([facility]) 01/25/2013  . SVT (supraventricular tachycardia) ([facility])   . Dementia   . CAD (coronary artery disease)   . Pre-syncope   . Sinus bradycardia   . Ejection fraction   . Anxiety   . Aortic root dilatation ([facility])   . Loose stools 06/21/2011  . 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 DM 05/19/2009  . HYPERLIPIDEMIA 05/19/2009    Past Surgical History:  Procedure Laterality Date  . ANAL FISSURE REPAIR    . COLONOSCOPY  06/29/11   internal hemorrhoids/severe diverticulosis, path negative for microscopic colitis repeat Oct 2017 with 2 days of clear liquids and Moviprep  . ESOPHAGOGASTRODUODENOSCOPY  06/29/11   barrett's esophagus/hiatal hernia/mild gastristis, s/p Savary dilation, path with no sprue, mild chronic gastritis noted, negative H.pylori, intestinal metaplasia consistent with barrett's, no dysplasia  . EXTERNAL EAR SURGERY    . HAND SURGERY    . NECK SURGERY    . skin cancer removal    . TONSILLECTOMY     30's  . TRIGGER FINGER RELEASE Right 10/29/2015   Procedure: RELEASE RIGHT  MIDDLE FINGER A-1 PULLEY, MANIPULATION PROXIMAL IPJ;  Surgeon: Daryll Brod, MD;  Location: [facility];  Service: Orthopedics;  Laterality: Right;  ANESTHESIA: IV REGIONAL FAB   . VASECTOMY         Home Medications    Prior to Admission medications   Medication Sig Start Date End Date Taking? Authorizing Provider  calcium carbonate (OS-CAL) 600 MG TABS tablet Take 1,000 mg by mouth daily.     Historical Provider, MD  COD LIVER OIL PO Take 5 mLs by mouth daily.     Historical Provider, MD  fish oil-omega-3 fatty acids 1000 MG capsule Take 2 g by mouth daily.    Historical Provider, MD  Garlic 0539 MG CAPS Take 1,000 mg by mouth daily.      Historical Provider, MD  memantine (NAMENDA) 5 MG tablet Take 5 mg by mouth 2 (two) times daily.    Historical Provider, MD  metFORMIN (GLUCOPHAGE-XR) 500 MG 24 hr  tablet Take 500 mg by mouth 2 (two) times daily.     Historical Provider, MD  Multiple Vitamin (MULTIVITAMIN) capsule Take 1 capsule by mouth daily.      Historical Provider, MD  omeprazole (PRILOSEC) 20 MG capsule Take 20 mg by mouth daily.     Historical Provider, MD  rivastigmine (EXELON) 9.5 mg/24hr Place 9.5 mg onto the skin daily.    Historical Provider, MD  simvastatin (ZOCOR) 40 MG tablet Take 40 mg by mouth at bedtime.  06/14/11   Historical Provider, MD  vitamin C (ASCORBIC ACID) 500 MG tablet Take 500 mg by mouth daily.    Historical Provider, MD  zinc gluconate 50 MG tablet Take 50 mg by mouth daily.      Historical Provider, MD    Family History Family History  Problem Relation Age of Onset  . High blood pressure Father   . Diabetes type II Father   . Colon cancer Neg Hx     Social History Social History  Substance Use Topics  . Smoking status: Former Smoker    Types: Cigarettes  . Smokeless tobacco: Never Used     Comment: smoked age [age]  . Alcohol use No     Comment: Quit 71 years old     Allergies   Aspirin; Codeine; Sulfa antibiotics; and Tomato   Review of Systems Review of Systems  Reason unable to perform ROS: Mild dementia.     Physical Exam Updated Vital Signs BP (!) 169/83   Pulse 78   Temp 98 F (36.7 C) (Oral)   Resp (!) 26   Ht 6\' 2"  (1.88 m)   Wt 200 lb (90.7 kg)   SpO2 97%   BMI 25.68 kg/m   Physical Exam  Constitutional: He is oriented to person, place, and time. He appears well-developed and well-nourished.  HENT:  Head: Normocephalic and atraumatic.  Eyes: Conjunctivae are normal.  Neck: Neck supple.  Cardiovascular: Normal rate and regular rhythm.   Pulmonary/Chest: Effort normal and breath sounds normal.  Abdominal: Soft. Bowel sounds are normal.  Musculoskeletal: Normal range of motion.  Neurological: He is alert and oriented to person, place, and time.  Skin: Skin is warm and dry.  Psychiatric:  Mild dementia.    Nursing note and vitals reviewed.    ED Treatments / Results  Labs (all labs ordered are listed, but only abnormal results are displayed) Labs Reviewed  COMPREHENSIVE METABOLIC PANEL - Abnormal; Notable for the following:  Result Value   Sodium 134 (*)    Chloride 97 (*)    Glucose, Bld 235 (*)    AST 117 (*)    ALT 133 (*)    All other components within normal limits  LIPASE, BLOOD  CBC  TROPONIN I    EKG  EKG Interpretation  Date/Time:  Thursday November 24 2016 07:15:55 EDT Ventricular Rate:  66 PR Interval:    QRS Duration: 93 QT Interval:  378 QTC Calculation: 396 R Axis:   -16 Text Interpretation:  Sinus rhythm Borderline left axis deviation Low voltage, precordial leads Abnormal R-wave progression, late transition Confirmed by Ranyia Witting  MD, Alixander Rallis ([id number]) on 11/24/2016 8:40:38 AM       Radiology No results found.  Procedures Procedures (including critical care time)  Medications Ordered in ED Medications - No data to display   Initial Impression / Assessment and Plan / ED Course  I have reviewed the triage vital signs and the nursing notes.  Pertinent labs & imaging results that were available during my care of the patient were reviewed by me and considered in my medical decision making (see chart for details).   patient opted to leave [facility]. The registered nurse, the nursing supervisor, the wife and myself all tried to convince him to stay. I eventually spoke to his wife on the telephone at [phone] and discussed the tests including elevated glucose and elevated liver functions. She will attempt to encourage him to seek further medical advice.    Final Clinical Impressions(s) / ED Diagnoses   Final diagnoses:  Chest pain, unspecified type  Hyperglycemia  Elevated liver enzymes    New Prescriptions Discharge Medication List as of 11/24/2016  9:35 AM       Nat Christen, MD 11/24/16 1409

## 2016-11-24 NOTE — ED Notes (Signed)
Pt was adamant he was going to leave.  Began to escalate.  Raynelle Chary, myself, and Dr. Lacinda Axon all spoke with pt and were unsuccessful in deescalating the situation.  Pt has background of dementia and wife is unsure of how to handle him at home.  Will contact social work to see what resources available to her.

## 2016-11-24 NOTE — ED Triage Notes (Signed)
Pt reports that pain in upper abdomen "pt reports across lungs". Pt started yesterday, but woke up with pain worsened. Denies N/V/D. No cough

## 2016-12-20 ENCOUNTER — Ambulatory Visit (HOSPITAL_COMMUNITY): Payer: Medicare Other | Attending: Pulmonary Disease

## 2016-12-22 ENCOUNTER — Encounter (HOSPITAL_COMMUNITY): Payer: Self-pay | Admitting: Emergency Medicine

## 2016-12-22 ENCOUNTER — Emergency Department (HOSPITAL_COMMUNITY)
Admission: EM | Admit: 2016-12-22 | Discharge: 2016-12-22 | Disposition: A | Discharge status: Home / Self Care | Payer: Medicare Other | Attending: Emergency Medicine | Admitting: Emergency Medicine

## 2016-12-22 DIAGNOSIS — I2510 Atherosclerotic heart disease of native coronary artery without angina pectoris: Secondary | ICD-10-CM | POA: Insufficient documentation

## 2016-12-22 DIAGNOSIS — E119 Type 2 diabetes mellitus without complications: Secondary | ICD-10-CM | POA: Insufficient documentation

## 2016-12-22 DIAGNOSIS — K625 Hemorrhage of anus and rectum: Secondary | ICD-10-CM | POA: Diagnosis present

## 2016-12-22 DIAGNOSIS — Z87891 Personal history of nicotine dependence: Secondary | ICD-10-CM | POA: Diagnosis not present

## 2016-12-22 LAB — COMPREHENSIVE METABOLIC PANEL
ALBUMIN: 4 g/dL (ref 3.5–5.0)
ALT: 116 U/L — ABNORMAL HIGH (ref 17–63)
ANION GAP: 11 (ref 5–15)
AST: 97 U/L — ABNORMAL HIGH (ref 15–41)
Alkaline Phosphatase: 94 U/L (ref 38–126)
BILIRUBIN TOTAL: 0.9 mg/dL (ref 0.3–1.2)
BUN: 9 mg/dL (ref 6–20)
CHLORIDE: 98 mmol/L — AB (ref 101–111)
CO2: 24 mmol/L (ref 22–32)
Calcium: 9.4 mg/dL (ref 8.9–10.3)
Creatinine, Ser: 0.91 mg/dL (ref 0.61–1.24)
GFR calc Af Amer: >60 mL/min (ref >60)
GLUCOSE: 253 mg/dL — AB (ref 65–99)
POTASSIUM: 3.6 mmol/L (ref 3.5–5.1)
Sodium: 133 mmol/L — ABNORMAL LOW (ref 135–145)
TOTAL PROTEIN: 7.6 g/dL (ref 6.5–8.1)

## 2016-12-22 LAB — CBC WITH DIFFERENTIAL/PLATELET
Basophils Absolute: 0 10*3/uL (ref 0.0–0.1)
Basophils Relative: 1 %
Eosinophils Absolute: 0.1 10*3/uL (ref 0.0–0.7)
Eosinophils Relative: 2 %
HEMATOCRIT: 45.4 % (ref 39.0–52.0)
HEMOGLOBIN: 15.5 g/dL (ref 13.0–17.0)
LYMPHS ABS: 1.9 10*3/uL (ref 0.7–4.0)
Lymphocytes Relative: 28 %
MCH: 31.3 pg (ref 26.0–34.0)
MCHC: 34.1 g/dL (ref 30.0–36.0)
MCV: 91.7 fL (ref 78.0–100.0)
Monocytes Absolute: 0.5 10*3/uL (ref 0.1–1.0)
Monocytes Relative: 7 %
NEUTROS ABS: 4.2 10*3/uL (ref 1.7–7.7)
Neutrophils Relative %: 62 %
Platelets: 197 10*3/uL (ref 150–400)
RBC: 4.95 MIL/uL (ref 4.22–5.81)
RDW: 13 % (ref 11.5–15.5)
WBC: 6.7 10*3/uL (ref 4.0–10.5)

## 2016-12-22 LAB — TYPE AND SCREEN
ABO/RH(D): O POS
ANTIBODY SCREEN: NEGATIVE

## 2016-12-22 LAB — PROTIME-INR
INR: 0.99
Prothrombin Time: 13.1 seconds (ref 11.4–15.2)

## 2016-12-22 LAB — POC OCCULT BLOOD, ED: FECAL OCCULT BLD: POSITIVE — AB

## 2016-12-22 MED ORDER — OMEPRAZOLE 20 MG PO CPDR: 20.0000 mg | DELAYED_RELEASE_CAPSULE | Freq: Every day | ORAL | 0 refills | Status: DC

## 2016-12-22 NOTE — ED Provider Notes (Signed)
Emergency Department Provider Note  By signing my name below, I, Hilbert Odor, attest that this documentation has been prepared under the direction and in the presence of Margette Fast, MD. Electronically Signed: Hilbert Odor, Scribe. 12/22/16. 9:36 AM.   I have reviewed the triage vital signs and the nursing notes.   HISTORY  Chief Complaint Rectal Bleeding  LEVEL 5 CAVEAT: Dementia HPI Comments: Joshua Khan is a 71 y.o. male with hx of GERD, HLD, and dementia who presents to the Emergency Department complaining of rectal bleeding that began this morning. The patient reports having about a "bowl" worth of blood come out of his rectum this morning. He describes the appearance as a "bright red" color. He states that this has happened in the past but not to this severity. He denies hx of liver problems or ulcers. He is unsure of any hx of hemorrhoids. He has GERD. He denies rectal pain, lightheadedness, nausea, or fevers/chills.   Past Medical History:  Diagnosis Date  . Anxiety    yes here latley  , no meds  . Aortic root dilatation (HCC)    Mild, echo, April, 2014  . Arthritis    hands  . Barrett's esophagus   . Blood transfusion    as child  . CAD (coronary artery disease)    Nonobstructive coronary disease by cath, 2007  . Cancer ([place])    basal ceel removed ,  one on head and one on right arm  . Cervical radiculopathy   . Chronic back pain   . Chronic neck pain   . Diabetes mellitus    ,  poor circulation  . Ejection fraction    EF 60%, catheterization, 2007  //   EF 60%, echo, April, 2014 while in hospital  . GERD (gastroesophageal reflux disease)    takes  prilosec  . Hiatal hernia   . Hypercholesterolemia   . Lumbar radiculopathy   . Memory change    April, 2014, patient is scheduled for neurologic testing Jan 25, 2013  . Pneumonia    3 times as baby  . Poor short term memory    on Aricept  . Pre-syncope    May, 2015  . S/P colonoscopy    ?  awaiting records?  . S/P endoscopy August 2007   Dr. Oneida Alar: chronic active gastritis, +H.pylori, ? treatment?, short-segment Barrett's  . Sinus bradycardia    Asymptomatic in 2007  . Sleep apnea    yes c pap  . SVT (supraventricular tachycardia) (HCC)    5 beats atrial tachycardia 48 hour Holter, May, 2014, no atrial fibrillation, no atrial flutter.    Patient Active Problem List   Diagnosis Date Noted  . Seizures ([place]) 01/25/2013  . SVT (supraventricular tachycardia) ([place])   . Dementia   . CAD (coronary artery disease)   . Pre-syncope   . Sinus bradycardia   . Ejection fraction   . Anxiety   . Aortic root dilatation ([place])   . Loose stools 06/21/2011  . 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 DM 05/19/2009  . HYPERLIPIDEMIA 05/19/2009    Past Surgical History:  Procedure Laterality Date  . ANAL FISSURE REPAIR    . COLONOSCOPY  06/29/11   internal hemorrhoids/severe diverticulosis, path negative for microscopic colitis repeat Oct 2017 with 2 days of clear liquids and Moviprep  . ESOPHAGOGASTRODUODENOSCOPY  06/29/11   barrett's esophagus/hiatal hernia/mild gastristis, s/p Savary dilation, path with no sprue, mild chronic gastritis noted, negative H.pylori, intestinal metaplasia consistent  with barrett's, no dysplasia  . EXTERNAL EAR SURGERY    . HAND SURGERY    . NECK SURGERY    . skin cancer removal    . TONSILLECTOMY     30's  . TRIGGER FINGER RELEASE Right 10/29/2015   Procedure: RELEASE RIGHT MIDDLE FINGER A-1 PULLEY, MANIPULATION PROXIMAL IPJ;  Surgeon: Daryll Brod, MD;  Location: [facility];  Service: Orthopedics;  Laterality: Right;  ANESTHESIA: IV REGIONAL FAB   . VASECTOMY      Current Outpatient Rx  . Order #: [id number] Class: Print    Allergies Aspirin; Codeine; Sulfa antibiotics; and Tomato  Family History  Problem Relation Age of Onset  . High blood pressure Father   . Diabetes type II Father   . Colon cancer Neg Hx     Social  History Social History  Substance Use Topics  . Smoking status: Former Smoker    Types: Cigarettes  . Smokeless tobacco: Never Used     Comment: smoked age [age]  . Alcohol use No     Comment: Quit 71 years old    Review of Systems ROS: Unable to obtain an accurate ROS due to dementia. 10-point ROS otherwise negative.  ____________________________________________   PHYSICAL EXAM:  VITAL SIGNS: Vitals:   12/22/16 0911 12/22/16 0930  BP: (!) 159/85 (!) 185/87  Pulse: 90 94  Resp: 18 (!) 22  Temp: 97.6 F (36.4 C)     Constitutional: Alert with confusion at times. Well appearing and in no acute distress. Eyes: Conjunctivae are normal.  Head: Atraumatic. Nose: No congestion/rhinnorhea. Mouth/Throat: Mucous membranes are moist.   Neck: No stridor.  Cardiovascular: Normal rate, regular rhythm. Good peripheral circulation. Grossly normal heart sounds.   Respiratory: Normal respiratory effort.  No retractions. Lungs CTAB. Gastrointestinal: Soft and nontender. No distention. No hemorrhoids. Brown stool that is faintly hemoccult positive.  Musculoskeletal: No lower extremity tenderness nor edema. No gross deformities of extremities. Neurologic:  Normal speech and language. No gross focal neurologic deficits are appreciated.  Skin:  Skin is warm, dry and intact. No rash noted.   ____________________________________________   LABS (all labs ordered are listed, but only abnormal results are displayed)  Labs Reviewed  COMPREHENSIVE METABOLIC PANEL - Abnormal; Notable for the following:       Result Value   Sodium 133 (*)    Chloride 98 (*)    Glucose, Bld 253 (*)    AST 97 (*)    ALT 116 (*)    All other components within normal limits  POC OCCULT BLOOD, ED - Abnormal; Notable for the following:    Fecal Occult Bld POSITIVE (*)    All other components within normal limits  CBC WITH DIFFERENTIAL/PLATELET  PROTIME-INR  TYPE AND SCREEN    ____________________________________________   PROCEDURES  Procedure(s) performed:   Procedures  None ____________________________________________   INITIAL IMPRESSION / ASSESSMENT AND PLAN / ED COURSE  Pertinent labs & imaging results that were available during my care of the patient were reviewed by me and considered in my medical decision making (see chart for details).  Patient presents to the emergency department for evaluation of bright red blood per rectum that started this morning. It has not recurred since. He has brown stool on exam with no gross blood or melena. The stool is weakly Hemoccult positive. No lightheadedness, weakness, or abdominal pain. Plan for lab work and reassessment. Patient not anticoagulated.   Made aware that patient stormed out of the ED prior  to receiving his discharge paperwork, instructions, return precautions, and follow up plan. Patient removed his IV and left abruptly. We were unable to convince him to return for further instructions and discussion.  ____________________________________________  FINAL CLINICAL IMPRESSION(S) / ED DIAGNOSES  Final diagnoses:  Rectal bleeding     MEDICATIONS GIVEN DURING THIS VISIT:  Medications - No data to display   NEW OUTPATIENT MEDICATIONS STARTED DURING THIS VISIT:  Discharge Medication List as of 12/22/2016 11:26 AM    START taking these medications   Details  omeprazole (PRILOSEC) 20 MG capsule Take 1 capsule (20 mg total) by mouth daily., Starting Thu 12/22/2016, Until Sat 01/21/2017, Print          Note:  This document was prepared using Dragon voice recognition software and may include unintentional dictation errors.  Nanda Quinton, MD Emergency Medicine  I personally performed the services described in this documentation, which was scribed in my presence. The recorded information has been reviewed and is accurate.      Margette Fast, MD 12/23/16 [phone]

## 2016-12-22 NOTE — ED Notes (Signed)
Patient states that he wants to leave AMA states " he hasn't seen anybody and we haven't done anything for him." MD notified that patient states he is ready to leave. When NT re-entered the room to talk to pt to let him know MD was going to review his chart now and get him home pt stated he did not care to see the MD he just wanted his IV out so he could leave. NT took patients IV and unhooked patient from the monitor and notified the charge nurse.

## 2016-12-22 NOTE — Discharge Instructions (Signed)
You were seen in the ED today with bleeding form the rectum. Your lab work looks good. You will need to see your primary care doctor for follow up blood work this week.   Return to the ED with any new or worsening bleeding, weakness, difficulty breathing, or chest pain.

## 2016-12-22 NOTE — ED Triage Notes (Signed)
Pt here for evaluation of rectal bleeding for the past 3 days.  States it is dark red blood.  Denies pain.

## 2017-01-11 ENCOUNTER — Encounter: Payer: Self-pay | Admitting: Family Medicine

## 2017-01-11 ENCOUNTER — Ambulatory Visit (INDEPENDENT_AMBULATORY_CARE_PROVIDER_SITE_OTHER): Payer: Medicare Other | Admitting: Family Medicine

## 2017-01-11 VITALS — BP 136/80 | HR 68 | Temp 97.3°F | Resp 18 | Ht 74.0 in | Wt 246.0 lb

## 2017-01-11 DIAGNOSIS — G300 Alzheimer's disease with early onset: Secondary | ICD-10-CM

## 2017-01-11 DIAGNOSIS — E785 Hyperlipidemia, unspecified: Secondary | ICD-10-CM | POA: Diagnosis not present

## 2017-01-11 DIAGNOSIS — E119 Type 2 diabetes mellitus without complications: Secondary | ICD-10-CM

## 2017-01-11 DIAGNOSIS — F0281 Dementia in other diseases classified elsewhere with behavioral disturbance: Secondary | ICD-10-CM

## 2017-01-11 DIAGNOSIS — Z7689 Persons encountering health services in other specified circumstances: Secondary | ICD-10-CM | POA: Diagnosis not present

## 2017-01-11 LAB — COMPLETE METABOLIC PANEL WITH GFR
ALBUMIN: 4.1 g/dL (ref 3.6–5.1)
ALK PHOS: 97 U/L (ref 40–115)
ALT: 119 U/L — AB (ref 9–46)
AST: 136 U/L — ABNORMAL HIGH (ref 10–35)
BILIRUBIN TOTAL: 0.8 mg/dL (ref 0.2–1.2)
BUN: 6 mg/dL — ABNORMAL LOW (ref 7–25)
CALCIUM: 9.2 mg/dL (ref 8.6–10.3)
CO2: 28 mmol/L (ref 20–31)
CREATININE: 0.92 mg/dL (ref 0.70–1.18)
Chloride: 98 mmol/L (ref 98–110)
GFR, EST NON AFRICAN AMERICAN: 82 mL/min (ref >=60)
Glucose, Bld: 311 mg/dL — ABNORMAL HIGH (ref 65–99)
Potassium: 3.7 mmol/L (ref 3.5–5.3)
Sodium: 134 mmol/L — ABNORMAL LOW (ref 135–146)
TOTAL PROTEIN: 7.2 g/dL (ref 6.1–8.1)

## 2017-01-11 LAB — CBC
HCT: 47.6 % (ref 38.5–50.0)
Hemoglobin: 15.6 g/dL (ref 13.2–17.1)
MCH: 30.8 pg (ref 27.0–33.0)
MCHC: 32.8 g/dL (ref 32.0–36.0)
MCV: 94.1 fL (ref 80.0–100.0)
MPV: 9.2 fL (ref 7.5–12.5)
Platelets: 221 10*3/uL (ref 140–400)
RBC: 5.06 MIL/uL (ref 4.20–5.80)
RDW: 13.7 % (ref 11.0–15.0)
WBC: 5.6 10*3/uL (ref 3.8–10.8)

## 2017-01-11 LAB — URINALYSIS, ROUTINE W REFLEX MICROSCOPIC
BILIRUBIN URINE: NEGATIVE
Hgb urine dipstick: NEGATIVE
Ketones, ur: NEGATIVE
LEUKOCYTES UA: NEGATIVE
Nitrite: NEGATIVE
PROTEIN: NEGATIVE
SPECIFIC GRAVITY, URINE: 1.012 (ref 1.001–1.035)
pH: 6 (ref 5.0–8.0)

## 2017-01-11 LAB — URINALYSIS, MICROSCOPIC ONLY
BACTERIA UA: NONE SEEN [HPF]
Casts: NONE SEEN [LPF]
Crystals: NONE SEEN [HPF]
RBC / HPF: NONE SEEN RBC/HPF (ref <=2)
Squamous Epithelial / HPF: NONE SEEN [HPF] (ref <=5)
WBC, UA: NONE SEEN WBC/HPF (ref <=5)
Yeast: NONE SEEN [HPF]

## 2017-01-11 LAB — LIPID PANEL
Cholesterol: 220 mg/dL — ABNORMAL HIGH (ref <200)
HDL: 26 mg/dL — ABNORMAL LOW (ref >40)
LDL Cholesterol: 144 mg/dL — ABNORMAL HIGH (ref <100)
Total CHOL/HDL Ratio: 8.5 Ratio — ABNORMAL HIGH (ref <5.0)
Triglycerides: 250 mg/dL — ABNORMAL HIGH (ref <150)
VLDL: 50 mg/dL — ABNORMAL HIGH (ref <30)

## 2017-01-11 LAB — VITAMIN D 25 HYDROXY (VIT D DEFICIENCY, FRACTURES): Vit D, 25-Hydroxy: 28 ng/mL — ABNORMAL LOW (ref 30–100)

## 2017-01-11 MED ORDER — SERTRALINE HCL 50 MG PO TABS: 50.0000 mg | ORAL_TABLET | Freq: Every day | ORAL | 1 refills | Status: DC

## 2017-01-11 MED ORDER — OMEPRAZOLE 40 MG PO CPDR: 40.0000 mg | DELAYED_RELEASE_CAPSULE | Freq: Every day | ORAL | 1 refills | Status: DC

## 2017-01-11 MED ORDER — MEMANTINE HCL 5 MG PO TABS: 5.0000 mg | ORAL_TABLET | Freq: Two times a day (BID) | ORAL | 1 refills | Status: DC

## 2017-01-11 MED ORDER — ATORVASTATIN CALCIUM 20 MG PO TABS: 20.0000 mg | ORAL_TABLET | Freq: Every day | ORAL | 1 refills | Status: DC

## 2017-01-11 NOTE — Patient Instructions (Signed)
Stop the supplements Take aspirin daily Take the multivitamin only Take sertraline once a day in the morning This is for the memory problems Take the namenda twice a day This helps the memory problems not get worse Take lipitor every evening This is to prevent heart attack Take the omeprazole once a day This is for a condition in your esophagus - it prevents cancer  I will send you a letter with your test results.  If there is anything of concern, we will call right away.  See me in a  month
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
## 2017-01-12 ENCOUNTER — Telehealth: Payer: Self-pay | Admitting: Family Medicine

## 2017-01-12 LAB — HEMOGLOBIN A1C
HEMOGLOBIN A1C: 9.7 % — AB (ref <5.7)
MEAN PLASMA GLUCOSE: 232 mg/dL

## 2017-01-12 NOTE — Telephone Encounter (Signed)
Patient's daughter came in this morning wanting to speak with the doctor so that she can get clarity on patient driving/or not driving.  She was not able to attend his visit yesterday.  She is trying to help her mother manage since he has Alzheimer'.  cb  [phone]

## 2017-01-12 NOTE — Telephone Encounter (Signed)
1. Make sure we have permission to talk 2. No he should not be driving.  I did NOT emphasize this to him because he was disagreeable and I didn't want him to fire me first visit.  In order to help him , and her mother I need to develop a relationship with him first and introduce my opinion later.  Hopefully next visit.

## 2017-01-12 NOTE — Telephone Encounter (Signed)
We need to get permission to talk to family

## 2017-01-13 ENCOUNTER — Encounter: Payer: Self-pay | Admitting: Family Medicine

## 2017-01-13 NOTE — Telephone Encounter (Signed)
Patient's son Emrick will be here on Monday with his dad to sign DPR allowing PHI to be disclosed to designated relatives.

## 2017-01-16 ENCOUNTER — Telehealth: Payer: Self-pay

## 2017-01-16 NOTE — Telephone Encounter (Signed)
Called and left daughter Joshua Khan a message to call me as we have signed releases.

## 2017-01-16 NOTE — Telephone Encounter (Signed)
Please get Joshua Khan. Number for me

## 2017-01-16 NOTE — Telephone Encounter (Signed)
So jim called. Given mds advice about driving. He asks that we try to call and talk to him first as his dad tends to listen to him better.

## 2017-01-30 ENCOUNTER — Telehealth: Payer: Self-pay | Admitting: Family Medicine

## 2017-01-30 NOTE — Telephone Encounter (Signed)
Form in office to review and sign.

## 2017-01-30 NOTE — Telephone Encounter (Signed)
Patient's son Joshua Khan brought in form from [facility] (medical request for driver re-examination) and requesting that you complete with your judgement.  He had to take his dad to [facility] on Friday because he currently has an active license.  The son feels that this shifts the burden from the family telling him that he cannot drive and the examiner can review the info provided by the doctor and a road test will be required in order for him continue to drive.  (His son feels that he will not pass the road test).  cb [phone]

## 2017-01-30 NOTE — Telephone Encounter (Signed)
done

## 2017-02-13 ENCOUNTER — Encounter: Payer: Self-pay | Admitting: Family Medicine

## 2017-02-13 ENCOUNTER — Ambulatory Visit (INDEPENDENT_AMBULATORY_CARE_PROVIDER_SITE_OTHER): Payer: Medicare Other | Admitting: Family Medicine

## 2017-02-13 VITALS — BP 134/72 | HR 62 | Temp 97.4°F | Resp 18 | Ht 74.0 in | Wt 241.0 lb

## 2017-02-13 DIAGNOSIS — G300 Alzheimer's disease with early onset: Secondary | ICD-10-CM | POA: Diagnosis not present

## 2017-02-13 DIAGNOSIS — F0281 Dementia in other diseases classified elsewhere with behavioral disturbance: Secondary | ICD-10-CM | POA: Diagnosis not present

## 2017-02-13 DIAGNOSIS — E119 Type 2 diabetes mellitus without complications: Secondary | ICD-10-CM
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
## 2017-02-13 NOTE — Patient Instructions (Signed)
No change in medicine.  Continue to eat well and stay active.  See me in 3 months We will do blood work next time

## 2017-04-10 ENCOUNTER — Encounter: Payer: Self-pay | Admitting: Family Medicine

## 2017-04-10 ENCOUNTER — Ambulatory Visit (INDEPENDENT_AMBULATORY_CARE_PROVIDER_SITE_OTHER): Payer: Medicare Other | Admitting: Family Medicine

## 2017-04-10 VITALS — BP 124/80 | HR 72 | Temp 98.4°F | Resp 18 | Ht 74.0 in | Wt 236.0 lb

## 2017-04-10 DIAGNOSIS — G300 Alzheimer's disease with early onset: Secondary | ICD-10-CM

## 2017-04-10 DIAGNOSIS — F02818 Dementia in other diseases classified elsewhere, unspecified severity, with other behavioral disturbance: Secondary | ICD-10-CM

## 2017-04-10 DIAGNOSIS — F0281 Dementia in other diseases classified elsewhere with behavioral disturbance: Secondary | ICD-10-CM

## 2017-04-10 NOTE — Progress Notes (Signed)
Chief Complaint  Patient presents with  . Follow-up    dmv form   Here with form, from [facility] to drive a vehicle He is today "healed by God" and no longer needs to take any medicine.  He has no complaint whatsoever.  States he eats well, sleeps well, has no pain.   His wife inquires about his continued weight loss.  I advised her there are often appetite changes with dementia.  Also he is an untreated diabetic with glucosuria, and this will cause him to lose weight. I explained to patient that he needed to do a MMSE to drive.  He cooperated to the best of his ability.  He scored a 17 on his last MMSE administered by neurology in 09/2015.  He scored a 13 today,   I explained to the patient and the family that in order to drive, he needed a score of 21.  I do not feel he can drive safely.   Wife says he sleeps a lot.  Awakens confused and delusional at times.  Cannot be left alone or will take off in car.  Always adamant there is nothing wrong with him.  Patient Active Problem List   Diagnosis Date Noted  . Seizures ([place]) 01/25/2013  . SVT (supraventricular tachycardia) ([place])   . Dementia with behavioral disturbance   . CAD (coronary artery disease)   . Pre-syncope   . Sinus bradycardia   . Anxiety   . Aortic root dilatation ([place])   . 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 Type 2 diabetes mellitus without complication, without long-term current use of insulin ([place]) 05/19/2009  . HLD (hyperlipidemia) 05/19/2009    Outpatient Encounter Prescriptions as of 04/10/2017  Medication Sig  . [DISCONTINUED] aspirin EC 81 MG tablet Take 81 mg by mouth daily.  . [DISCONTINUED] atorvastatin (LIPITOR) 20 MG tablet Take 1 tablet (20 mg total) by mouth daily. (Patient not taking: Reported on 04/10/2017)  . [DISCONTINUED] memantine (NAMENDA) 5 MG tablet Take 1 tablet (5 mg total) by mouth 2 (two) times daily. (Patient not taking: Reported on 04/10/2017)  . [DISCONTINUED] Multiple Vitamin (MULTIVITAMIN) capsule  Take 1 capsule by mouth daily.  . [DISCONTINUED] omeprazole (PRILOSEC) 40 MG capsule Take 1 capsule (40 mg total) by mouth daily. (Patient not taking: Reported on 04/10/2017)  . [DISCONTINUED] sertraline (ZOLOFT) 50 MG tablet Take 1 tablet (50 mg total) by mouth daily. (Patient not taking: Reported on 04/10/2017)   No facility-administered encounter medications on file as of 04/10/2017.     Allergies  Allergen Reactions  . Aspirin     Cannot take if not coated, makes stomach burn  . Codeine     REACTION: UNKNOWN REACTION  . Sulfa Antibiotics     Not sure of reaction  . Tomato Other (See Comments)    "feels like having heart attack" after eating    Review of Systems  Unable to perform ROS: Dementia  see HPI  BP 124/80 (BP Location: Right Arm, Patient Position: Sitting, Cuff Size: Large)   Pulse 72   Temp 98.4 F (36.9 C) (Temporal)   Resp 18   Ht 6\' 2"  (1.88 m)   Wt 236 lb (107 kg)   SpO2 96%   BMI 30.30 kg/m   Physical Exam  Constitutional: He appears well-developed and well-nourished. No distress.  HENT:  Head: Normocephalic and atraumatic.  Mouth/Throat: Oropharynx is clear and moist.  Cardiovascular: Normal rate, regular rhythm and normal heart sounds.   Pulmonary/Chest: Effort normal  and breath sounds normal.  Neurological: He is alert. He displays normal reflexes. No cranial nerve deficit. Coordination normal.  Psychiatric: His affect is angry. He is agitated. He exhibits abnormal recent memory.  He is not happy with my suggestion to stop driving and says he will refuse.  MMSE = 13  ASSESSMENT/PLAN:  1. Early onset Alzheimer's disease with behavioral disturbance Refusal to accept illness,  Refusal to take medicine, verbally abusive to wife   Patient Instructions  I will send a report to the [facility] See me in 3 months Call sooner for problems Greater than 50% of this visit was spent in counseling and coordinating care.  Total face to face time:   25 min  discussing alzheimers management, prognosis  Raylene Everts, MD

## 2017-04-10 NOTE — Patient Instructions (Signed)
I will send a report to the [facility] See me in 3 months Call sooner for problems

## 2017-05-16 ENCOUNTER — Ambulatory Visit: Payer: Medicare Other | Admitting: Family Medicine

## 2017-05-22 ENCOUNTER — Emergency Department (HOSPITAL_COMMUNITY)
Admission: EM | Admit: 2017-05-22 | Discharge: 2017-05-22 | Disposition: A | Discharge status: Home / Self Care | Payer: Medicare Other | Attending: Emergency Medicine | Admitting: Emergency Medicine

## 2017-05-22 ENCOUNTER — Encounter (HOSPITAL_COMMUNITY): Payer: Self-pay | Admitting: Emergency Medicine

## 2017-05-22 DIAGNOSIS — Z794 Long term (current) use of insulin: Secondary | ICD-10-CM | POA: Insufficient documentation

## 2017-05-22 DIAGNOSIS — Z87891 Personal history of nicotine dependence: Secondary | ICD-10-CM | POA: Insufficient documentation

## 2017-05-22 DIAGNOSIS — F0391 Unspecified dementia with behavioral disturbance: Secondary | ICD-10-CM | POA: Diagnosis not present

## 2017-05-22 DIAGNOSIS — M129 Arthropathy, unspecified: Secondary | ICD-10-CM | POA: Diagnosis present

## 2017-05-22 DIAGNOSIS — E119 Type 2 diabetes mellitus without complications: Secondary | ICD-10-CM | POA: Diagnosis not present

## 2017-05-22 DIAGNOSIS — I259 Chronic ischemic heart disease, unspecified: Secondary | ICD-10-CM | POA: Insufficient documentation

## 2017-05-22 DIAGNOSIS — Y939 Activity, unspecified: Secondary | ICD-10-CM | POA: Insufficient documentation

## 2017-05-22 DIAGNOSIS — M7022 Olecranon bursitis, left elbow: Secondary | ICD-10-CM | POA: Insufficient documentation

## 2017-05-22 HISTORY — DX: Unspecified dementia, unspecified severity, without behavioral disturbance, psychotic disturbance, mood disturbance, and anxiety: F03.90

## 2017-05-22 LAB — SYNOVIAL CELL COUNT + DIFF, W/ CRYSTALS
Crystals, Fluid: NONE SEEN
EOSINOPHILS-SYNOVIAL: 0 % (ref 0–1)
LYMPHOCYTES-SYNOVIAL FLD: 0 % (ref 0–20)
MONOCYTE-MACROPHAGE-SYNOVIAL FLUID: 6 % — AB (ref 50–90)
Neutrophil, Synovial: 94 % — ABNORMAL HIGH (ref 0–25)
OTHER CELLS-SYN: 0
WBC, Synovial: UNDETERMINED /mm3 (ref 0–200)

## 2017-05-22 LAB — CBC WITH DIFFERENTIAL/PLATELET
BASOS ABS: 0 10*3/uL (ref 0.0–0.1)
Basophils Relative: 0 %
EOS PCT: 1 %
Eosinophils Absolute: 0.1 10*3/uL (ref 0.0–0.7)
HEMATOCRIT: 47.6 % (ref 39.0–52.0)
Hemoglobin: 16.4 g/dL (ref 13.0–17.0)
LYMPHS PCT: 17 %
Lymphs Abs: 1.7 10*3/uL (ref 0.7–4.0)
MCH: 31.8 pg (ref 26.0–34.0)
MCHC: 34.5 g/dL (ref 30.0–36.0)
MCV: 92.4 fL (ref 78.0–100.0)
MONO ABS: 1.1 10*3/uL — AB (ref 0.1–1.0)
MONOS PCT: 11 %
Neutro Abs: 6.7 10*3/uL (ref 1.7–7.7)
Neutrophils Relative %: 71 %
PLATELETS: 174 10*3/uL (ref 150–400)
RBC: 5.15 MIL/uL (ref 4.22–5.81)
RDW: 13.3 % (ref 11.5–15.5)
WBC: 9.5 10*3/uL (ref 4.0–10.5)

## 2017-05-22 LAB — URIC ACID: Uric Acid, Serum: 4.8 mg/dL (ref 4.4–7.6)

## 2017-05-22 MED ORDER — DEXAMETHASONE SODIUM PHOSPHATE 10 MG/ML IJ SOLN
10.0000 mg | Freq: Once | INTRAMUSCULAR | Status: DC
  Filled 2017-05-22: qty 1

## 2017-05-22 MED ORDER — HYDROCODONE-ACETAMINOPHEN 5-325 MG PO TABS: 1.0000 | ORAL_TABLET | Freq: Four times a day (QID) | ORAL | 0 refills | Status: DC | PRN

## 2017-05-22 MED ORDER — ONDANSETRON HCL 4 MG PO TABS
4.0000 mg | ORAL_TABLET | Freq: Once | ORAL | Status: DC
  Filled 2017-05-22: qty 1

## 2017-05-22 MED ORDER — HYDROCODONE-ACETAMINOPHEN 5-325 MG PO TABS
1.0000 | ORAL_TABLET | Freq: Once | ORAL | Status: DC
  Filled 2017-05-22: qty 1

## 2017-05-22 MED ORDER — DEXAMETHASONE 4 MG PO TABS: 4.0000 mg | ORAL_TABLET | Freq: Two times a day (BID) | ORAL | 0 refills | Status: DC

## 2017-05-22 MED ORDER — DICLOFENAC SODIUM 1 % TD GEL: TRANSDERMAL | 0 refills | Status: DC

## 2017-05-22 NOTE — ED Notes (Signed)
Pt continues to refuse medication after speaking with Dr Dayna Barker.

## 2017-05-22 NOTE — Discharge Instructions (Signed)
Your examination is consistent with a bursitis involving her left elbow. Please applyVoltaren gel to the elbow 3 times daily. Use Tylenol for mild pain, use Norco for more severe pain. Please use Decadron 2 times daily with food. Please see Dr. Meda Coffee for follow-up of the bursitis of your elbow. Please return to the emergency department if any signs of major infection or other problems.

## 2017-05-22 NOTE — ED Triage Notes (Signed)
Patient c/o left elbow swelling, pain, and redness. Denies any injury. Per patient started last night and progressively getting worse. Denies any fevers.

## 2017-05-22 NOTE — ED Notes (Signed)
Pt refuses medication  

## 2017-05-22 NOTE — ED Provider Notes (Signed)
[facility] DEPT Provider Note   CSN: [id number] Arrival date & time: 05/22/17  1059     History   Chief Complaint Chief Complaint  Patient presents with  . Joint Swelling    HPI Joshua Khan is a 71 y.o. male.  Patient is a 71 year old male who presents to the emergency department with a complaint of swelling of his left elbow.  The patient states that this problem started on last night. His wife states that she noted more swelling of the left elbow than on the right. She felt it was warm to touch and she brought him into the emergency department. The patient has not had any known injury to the elbow. He's not had any fever or chills. The wife has not noted any red streaks going up the arm. She presents to the emergency department because of the pain getting progressively worse and the swelling.      Past Medical History:  Diagnosis Date  . Allergy   . Anxiety    yes here [place]  , no meds  . Aortic root dilatation (HCC)    Mild, echo, April, 2014  . Arthritis    hands  . Barrett's esophagus   . Blood transfusion    as child  . CAD (coronary artery disease)    Nonobstructive coronary disease by cath, 2007  . Cancer ([place])    basal ceel removed ,  one on head and one on right arm  . Cervical radiculopathy   . Chronic back pain   . Chronic neck pain   . Dementia   . Diabetes mellitus    ,  poor circulation  . Ejection fraction    EF 60%, catheterization, 2007  //   EF 60%, echo, April, 2014 while in hospital  . GERD (gastroesophageal reflux disease)    takes  prilosec  . Hiatal hernia   . Hypercholesterolemia   . Lumbar radiculopathy   . Memory change    April, 2014, patient is scheduled for neurologic testing Jan 25, 2013  . Pneumonia    3 times as baby  . Poor short term memory    on Aricept  . Pre-syncope    May, 2015  . S/P colonoscopy    ? awaiting records?  . S/P endoscopy August 2007   Dr. Oneida Alar: chronic active gastritis, +H.pylori, ?  treatment?, short-segment Barrett's  . Seizures ([place])   . Sinus bradycardia    Asymptomatic in 2007  . Sleep apnea    yes c pap  . SVT (supraventricular tachycardia) (HCC)    5 beats atrial tachycardia 48 hour Holter, May, 2014, no atrial fibrillation, no atrial flutter.    Patient Active Problem List   Diagnosis Date Noted  . Seizures ([place]) 01/25/2013  . SVT (supraventricular tachycardia) ([place])   . Dementia with behavioral disturbance   . CAD (coronary artery disease)   . Pre-syncope   . Sinus bradycardia   . Anxiety   . Aortic root dilatation ([place])   . 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 Type 2 diabetes mellitus without complication, without long-term current use of insulin ([place]) 05/19/2009  . HLD (hyperlipidemia) 05/19/2009    Past Surgical History:  Procedure Laterality Date  . ANAL FISSURE REPAIR    . COLONOSCOPY  06/29/11   internal hemorrhoids/severe diverticulosis, path negative for microscopic colitis repeat Oct 2017 with 2 days of clear liquids and Moviprep  . ESOPHAGOGASTRODUODENOSCOPY  06/29/11   barrett's esophagus/hiatal hernia/mild gastristis, s/p Savary dilation, path  with no sprue, mild chronic gastritis noted, negative H.pylori, intestinal metaplasia consistent with barrett's, no dysplasia  . EXTERNAL EAR SURGERY    . HAND SURGERY    . NECK SURGERY    . skin cancer removal    . TONSILLECTOMY     30's  . TRIGGER FINGER RELEASE Right 10/29/2015   Procedure: RELEASE RIGHT MIDDLE FINGER A-1 PULLEY, MANIPULATION PROXIMAL IPJ;  Surgeon: Daryll Brod, MD;  Location: [facility];  Service: Orthopedics;  Laterality: Right;  ANESTHESIA: IV REGIONAL FAB   . VASECTOMY         Home Medications    Prior to Admission medications   Not on File    Family History Family History  Problem Relation Age of Onset  . Heart disease Mother   . High blood pressure Father   . Diabetes type II Father   . Diabetes Father   . Kidney disease Father   . Cancer  Sister        breast cancer  . COPD Son   . Cancer Brother        lung  . Colon cancer Neg Hx     Social History Social History  Substance Use Topics  . Smoking status: Former Smoker    Types: Cigarettes  . Smokeless tobacco: Never Used     Comment: smoked age [age]  . Alcohol use No     Comment: Quit 71 years old     Allergies   Aspirin; Codeine; Sulfa antibiotics; and Tomato   Review of Systems Review of Systems  Constitutional: Negative for activity change.       All ROS Neg except as noted in HPI  HENT: Negative for nosebleeds.   Eyes: Negative for photophobia and discharge.  Respiratory: Negative for cough, shortness of breath and wheezing.   Cardiovascular: Negative for chest pain and palpitations.  Gastrointestinal: Negative for abdominal pain and blood in stool.  Genitourinary: Negative for dysuria, frequency and hematuria.  Musculoskeletal: Positive for arthralgias and back pain. Negative for neck pain.  Skin: Negative.   Neurological: Negative for dizziness, seizures and speech difficulty.  Psychiatric/Behavioral: Negative for confusion and hallucinations.     Physical Exam Updated Vital Signs BP 127/82 (BP Location: Right Arm)   Pulse 97   Temp 97.9 F (36.6 C) (Oral)   Resp 18   Ht 6\' 1"  (1.854 m)   Wt 105.4 kg (232 lb 4.8 oz)   SpO2 97%   BMI 30.65 kg/m   Physical Exam  Constitutional: He is oriented to person, place, and time. He appears well-developed and well-nourished.  Non-toxic appearance.  HENT:  Head: Normocephalic.  Right Ear: Tympanic membrane and external ear normal.  Left Ear: Tympanic membrane and external ear normal.  Eyes: Pupils are equal, round, and reactive to light. EOM and lids are normal.  Neck: Normal range of motion. Neck supple. Carotid bruit is not present.  Cardiovascular: Normal rate, regular rhythm, normal heart sounds, intact distal pulses and normal pulses.   Pulmonary/Chest: Breath sounds normal. No respiratory  distress.  Abdominal: Soft. Bowel sounds are normal. There is no tenderness. There is no guarding.  Musculoskeletal: He exhibits tenderness.  There is good range of motion of the left shoulder, but with crepitus. There is evidence of olecranon bursitis with swelling at the left olecranon bursa. There is good range of motion of the elbow. There no red streaks appreciated. The area is warm but not hot. There is full range of motion  of the left wrist and fingers. Capillary refill is less than 2 seconds. The radial pulses 2+.  Lymphadenopathy:       Head (right side): No submandibular adenopathy present.       Head (left side): No submandibular adenopathy present.    He has no cervical adenopathy.  Neurological: He is alert and oriented to person, place, and time. He has normal strength. No cranial nerve deficit or sensory deficit.  Skin: Skin is warm and dry.  Psychiatric: He has a normal mood and affect. His speech is normal.  Nursing note and vitals reviewed.    ED Treatments / Results  Labs (all labs ordered are listed, but only abnormal results are displayed) Labs Reviewed  CBC WITH DIFFERENTIAL/PLATELET - Abnormal; Notable for the following:       Result Value   Monocytes Absolute 1.1 (*)    All other components within normal limits  URIC ACID    EKG  EKG Interpretation None       Radiology No results found.  Procedures .Joint Aspiration/Arthrocentesis Date/Time: 05/22/2017 2:07 PM Performed by: Lily Kocher Authorized by: Lily Kocher   Consent:    Consent obtained:  Verbal   Consent given by:  Patient   Risks discussed:  Bleeding, infection and pain Location:    Location:  Elbow   Elbow:  L elbow Anesthesia (see MAR for exact dosages):    Anesthesia method:  Local infiltration   Local anesthetic:  Bupivacaine 0.25% w/o epi Procedure details:    Preparation: Patient was prepped and draped in usual sterile fashion     Needle gauge:  18 G   Approach:   Inferior   Aspirate amount:  7   Aspirate characteristics:  Blood-tinged   Steroid injected: no     Specimen collected: yes   Post-procedure details:    Dressing:  Sterile dressing   Patient tolerance of procedure:  Tolerated well, no immediate complications   (including critical care time)  Medications Ordered in ED Medications  dexamethasone (DECADRON) injection 10 mg (not administered)  HYDROcodone-acetaminophen (NORCO/VICODIN) 5-325 MG per tablet 1 tablet (not administered)  ondansetron (ZOFRAN) tablet 4 mg (not administered)     Initial Impression / Assessment and Plan / ED Course  I have reviewed the triage vital signs and the nursing notes.  Pertinent labs & imaging results that were available during my care of the patient were reviewed by me and considered in my medical decision making (see chart for details).       Final Clinical Impressions(s) / ED Diagnoses MDM Vital signs within normal limits. Complete blood count is within normal limits. The examination suggest olecranon bursitis. There no findings on examination or history to suggest a bacterial issue. The patient will be treated with short course of steroid. He will use hydrocodone every 6 hours if needed for pain. He cannot take aspirin or NSAID related medications. I've asked patient to follow-up with Dr. Meda Coffee concerning his bursitis. Patient and wife are in agreement with this plan.    Final diagnoses:  Olecranon bursitis of left elbow    New Prescriptions New Prescriptions   DEXAMETHASONE (DECADRON) 4 MG TABLET    Take 1 tablet (4 mg total) by mouth 2 (two) times daily with a meal.   DICLOFENAC SODIUM (VOLTAREN) 1 % GEL    Apply to left elbow three times daily.   HYDROCODONE-ACETAMINOPHEN (NORCO/VICODIN) 5-325 MG TABLET    Take 1 tablet by mouth every 6 (six) hours as needed (  pain).     Lily Kocher, PA-C 05/22/17 1343    Lily Kocher, PA-C 05/28/17 1619    Merrily Pew, MD 05/30/17  [phone]

## 2017-05-22 NOTE — ED Provider Notes (Signed)
Medical screening examination/treatment/procedure(s) were conducted as a shared visit with non-physician practitioner(s) and myself.  I personally evaluated the patient during the encounter.  On my examination patient is refusing his medications. Patient appears well and states he feels like his elbow feels better and does not want to take his medications as he does not know if very well. I discussed this with his wife and his wife will have him follow with primary doctor for reevaluation of his elbow pain and if not improving to try to get them to help convince him to take his medicines.   Merrily Pew, MD 05/22/17 [phone]

## 2017-05-29 ENCOUNTER — Other Ambulatory Visit: Payer: Self-pay | Admitting: Family Medicine

## 2017-05-30 ENCOUNTER — Other Ambulatory Visit: Payer: Self-pay | Admitting: Family Medicine

## 2017-05-30 ENCOUNTER — Telehealth: Payer: Self-pay

## 2017-05-30 MED ORDER — MEMANTINE HCL 5 MG PO TABS: 5.0000 mg | ORAL_TABLET | Freq: Two times a day (BID) | ORAL | 6 refills | Status: DC

## 2017-05-30 NOTE — Telephone Encounter (Signed)
signed

## 2017-06-29 ENCOUNTER — Other Ambulatory Visit: Payer: Self-pay | Admitting: Family Medicine

## 2017-07-03 NOTE — Telephone Encounter (Signed)
Seen 6 18 18

## 2017-07-11 ENCOUNTER — Ambulatory Visit: Payer: Medicare Other | Admitting: Family Medicine

## 2017-08-02 ENCOUNTER — Other Ambulatory Visit: Payer: Self-pay | Admitting: Family Medicine

## 2017-08-14 ENCOUNTER — Other Ambulatory Visit: Payer: Self-pay

## 2017-08-19 ENCOUNTER — Other Ambulatory Visit: Payer: Self-pay | Admitting: Family Medicine

## 2017-08-31 ENCOUNTER — Other Ambulatory Visit: Payer: Self-pay

## 2017-08-31 ENCOUNTER — Encounter: Payer: Self-pay | Admitting: Family Medicine

## 2017-08-31 ENCOUNTER — Ambulatory Visit: Payer: Medicare Other | Admitting: Family Medicine

## 2017-08-31 VITALS — BP 126/80 | HR 92 | Temp 97.7°F | Resp 16 | Ht 74.0 in | Wt 225.1 lb

## 2017-08-31 DIAGNOSIS — F0281 Dementia in other diseases classified elsewhere with behavioral disturbance: Secondary | ICD-10-CM | POA: Diagnosis not present

## 2017-08-31 DIAGNOSIS — Z9114 Patient's other noncompliance with medication regimen: Secondary | ICD-10-CM | POA: Diagnosis not present

## 2017-08-31 DIAGNOSIS — Z8639 Personal history of other endocrine, nutritional and metabolic disease: Secondary | ICD-10-CM | POA: Diagnosis not present

## 2017-08-31 DIAGNOSIS — E785 Hyperlipidemia, unspecified: Secondary | ICD-10-CM

## 2017-08-31 DIAGNOSIS — G300 Alzheimer's disease with early onset: Secondary | ICD-10-CM

## 2017-08-31 DIAGNOSIS — Z91148 Patient's other noncompliance with medication regimen for other reason: Secondary | ICD-10-CM | POA: Insufficient documentation

## 2017-08-31 DIAGNOSIS — Z7289 Other problems related to lifestyle: Secondary | ICD-10-CM

## 2017-08-31 LAB — POCT URINALYSIS DIPSTICK
Bilirubin, UA: NEGATIVE
Blood, UA: NEGATIVE
GLUCOSE UA: 500
Ketones, UA: NEGATIVE
LEUKOCYTES UA: NEGATIVE
NITRITE UA: NEGATIVE
PROTEIN UA: NEGATIVE
Urobilinogen, UA: 0.2 E.U./dL
pH, UA: 6 (ref 5.0–8.0)

## 2017-08-31 MED ORDER — QUETIAPINE FUMARATE 25 MG PO TABS: 25.0000 mg | ORAL_TABLET | Freq: Every day | ORAL | 3 refills | Status: DC

## 2017-08-31 NOTE — Patient Instructions (Signed)
I will send a letter See me every three months

## 2017-08-31 NOTE — Progress Notes (Signed)
Chief Complaint  Patient presents with  . Follow-up   [name] is here for routine follow-up.  He is brought in by his wife Joshua Khan.  He has significant dementia with behavioral problems and she is having difficulty coping with him.  She is tearful and states she is at her wits end.  Since I saw him 3 months ago he has continued to lose weight.  He continues to refuse all medication.  He is a known diabetic.  He has urinary frequency.  He is losing weight.  Spot urine test today shows large amount of glucose in a very dilute specimen.  His opinion is that he is "healed by God" and no longer has any medical problems that required care.  He is compliant with my recommendation that he not drive, although I had reported him to the [facility] and his license was taken from him.  He has a new disturbing symptom to his wife, he has sexually inappropriate behavior.  She states that he awakens all night long and fondles her and tries to have sexual relations although he is in capable of accomplishing sexual intercourse.  She has not getting enough sleep.  This is exacerbating her stress.  He insists that he sleeps all night. I tried to talk to him about his medication.  I explained that it was important that he take medication.  He absolutely refuses.  I asked if he would take a medication at night to help him sleep.  He insists he has no trouble sleeping.  I tried to explain that he was interrupting him on sleep and he says "give her the pill then." Eventually we did decide to prescribe Seroquel.  I explained to the patient and his wife that there is a black box warning for Seroquel and or death.  I told him that the quality of life was more important than the quality of life and that if he is unable to be kept at home, his quality of life would not be good at a nursing facility.  The family understands and agrees to take the medication (especially the wife since she is the caregiver)   Patient Active Problem List     Diagnosis Date Noted  . Seizures ([place]) 01/25/2013  . SVT (supraventricular tachycardia) ([place])   . Dementia with behavioral disturbance   . CAD (coronary artery disease)   . Pre-syncope   . Sinus bradycardia   . Anxiety   . Aortic root dilatation ([place])   . 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 Type 2 diabetes mellitus without complication, without long-term current use of insulin ([place]) 05/19/2009  . HLD (hyperlipidemia) 05/19/2009    Outpatient Encounter Medications as of 08/31/2017  Medication Sig  Patient refuses all medications No facility-administered encounter medications on file as of 08/31/2017.     Allergies  Allergen Reactions  . Aspirin     Cannot take if not coated, makes stomach burn  . Codeine     REACTION: UNKNOWN REACTION  . Sulfa Antibiotics     Not sure of reaction  . Tomato Other (See Comments)    "feels like having heart attack" after eating    Review of Systems  Constitutional: Positive for unexpected weight change. Negative for activity change and appetite change.       Continued moderate weight loss  HENT: Negative.   Eyes: Negative.   Respiratory: Negative.   Cardiovascular: Negative.   Gastrointestinal: Negative for constipation and diarrhea.  Genitourinary: Positive for  frequency.       Per wife  Musculoskeletal:       Balance poor per wife  Psychiatric/Behavioral: Positive for agitation, behavioral problems, confusion and sleep disturbance. Negative for dysphoric mood. The patient is not nervous/anxious.     BP 126/80 (BP Location: Left Arm, Patient Position: Sitting, Cuff Size: Large)   Pulse 92   Temp 97.7 F (36.5 C) (Temporal)   Resp 16   Ht 6\' 2"  (1.88 m)   Wt 225 lb 1.9 oz (102.1 kg)   SpO2 97%   BMI 28.90 kg/m   Physical Exam  Constitutional: He appears well-developed and well-nourished. No distress.  HENT:  Head: Normocephalic and atraumatic.  Mouth/Throat: Oropharynx is clear and moist.  Eyes: Conjunctivae are normal. Pupils  are equal, round, and reactive to light.  Cardiovascular: Normal rate, regular rhythm and normal heart sounds.  Pulmonary/Chest: Effort normal and breath sounds normal. No respiratory distress.  Musculoskeletal: Normal range of motion. He exhibits no edema.  Skin: Skin is warm and dry.  Psychiatric:  Irritable.  Claims he feels "great", but ask irritable or angry when asked about his medications and prior illnesses.     ASSESSMENT/PLAN:  1. Early onset Alzheimer's disease with behavioral disturbance Refusal to take all medications. "Healed by God". Sexually inappropriate behavior. Wife experiencing caregiver stress.  2. History of diabetes mellitus Currently untreated.  Weight loss.  Glycosuria. - POCT Urinalysis Dipstick  Discussion; I am unable to have a discussion with wife in front of the hospital without him becoming irritable and angry.  She is also reluctant to talk with him in the room.  He will not leave the room to allow she denied to have a discussion.  I have encouraged her to call me when she has the ability to talk.  I am also going to send her a letter with some resources and advice.  Ideally, for his health, she should try to sneak metformin and his food by crushing and putting into applesauce or similar dish.  In reality, this would be difficult.  As long as he is not having symptoms, I think she could try to be more attentive to his diet.  She also, for her health, should try to get him to take an antidepressant and possibly antipsychotic to manage his behaviors.  She states that he is out of sertraline, and when he was on this he did not seem to have any improvement.  He also took Namenda for short period of time but this did not appear to have benefit.  Is as documented above she agrees to Seroquel after discussion of risks Patient Instructions  I will send a letter See me every three months   Raylene Everts, MD

## 2017-10-02 ENCOUNTER — Emergency Department (HOSPITAL_COMMUNITY)
Admission: EM | Admit: 2017-10-02 | Discharge: 2017-10-02 | Discharge status: Against Medical Advice | Payer: Medicare Other

## 2017-10-02 NOTE — ED Notes (Signed)
Called pt for triage, pt came to triage doors and states he does not want to be seen at this facility. Denied CP at this time. Pt states he will return if needed.

## 2017-10-18 ENCOUNTER — Telehealth: Payer: Self-pay | Admitting: Family Medicine

## 2017-10-18 NOTE — Telephone Encounter (Signed)
Patient's son, Albeiro, left message on nurse line requesting call back regarding patient. No other details  Callback# [phone]

## 2017-10-18 NOTE — Telephone Encounter (Signed)
Called and spoke to son. He c/o his dad not sleeping at night. I reminded him you had written a rx for seroquel in January. They have not picked this up yet...they are going to. Lannis Lichtenwalner and his wife have moved in with their son & wife.

## 2017-10-24 ENCOUNTER — Other Ambulatory Visit: Payer: Self-pay | Admitting: Family Medicine

## 2017-11-06 ENCOUNTER — Encounter: Payer: Self-pay | Admitting: Family Medicine

## 2017-11-13 ENCOUNTER — Ambulatory Visit: Payer: Medicare Other | Admitting: Family Medicine

## 2017-11-13 ENCOUNTER — Telehealth: Payer: Self-pay | Admitting: Family Medicine

## 2017-11-13 NOTE — Telephone Encounter (Signed)
Patients daughter called in because patients [facility] [place] on [address], says they need to speak to physician before they will medication for seroquel and meds for his dementia *she isnt sure of the name for this medication.  Pharmacy [phone]   Daughter Clarene Critchley [phone]

## 2017-11-14 NOTE — Telephone Encounter (Signed)
I could not reach daughter, so I called the pharmacy. They have no record of any 'problem.' They will call back with any problems

## 2017-12-01 ENCOUNTER — Telehealth: Payer: Self-pay | Admitting: Family Medicine

## 2017-12-01 ENCOUNTER — Other Ambulatory Visit: Payer: Self-pay | Admitting: Family Medicine

## 2018-01-04 ENCOUNTER — Telehealth: Payer: Self-pay | Admitting: Family Medicine

## 2018-01-04 NOTE — Telephone Encounter (Signed)
Patients family has called 3 times in the past 2 days seeking help for patient. He moved in with his son Raun and family, never changed his address in the chart so they were unaware that you have transferred offices and they are franticly looking for help due to patient becoming violent. Rosaria Ferries advised [employer] but the family is having a hard time with that advise because of the circumstances. They are requesting your advise because you are aware of the situation and they are anxious for help.  Jceon (son) 336/ [phone]

## 2018-01-04 NOTE — Telephone Encounter (Signed)
They do not have a lot of options because of his refusal to take medicines.  He has been prescribed medicine - I think Seroquel - which helps with this behavior.  Could call mental health crisis line.  Maybe alzheimers association.  Needs placement in a home. Needs to go to the ER for a shot of haldol if the son can talk him into it, and they will care for him.  Sorry for their trouble.

## 2018-01-05 NOTE — Telephone Encounter (Signed)
I have forwarded this information on to patients son, I will get #"s to the crisis line & alzheimer's association for him, thank you for your help(:

## 2018-01-07 ENCOUNTER — Emergency Department (HOSPITAL_COMMUNITY)
Admission: EM | Admit: 2018-01-07 | Discharge: 2018-01-10 | Disposition: A | Discharge status: Home / Self Care | Payer: Medicare Other | Attending: Emergency Medicine | Admitting: Emergency Medicine

## 2018-01-07 ENCOUNTER — Encounter (HOSPITAL_COMMUNITY): Payer: Self-pay | Admitting: Emergency Medicine

## 2018-01-07 ENCOUNTER — Other Ambulatory Visit: Payer: Self-pay

## 2018-01-07 DIAGNOSIS — Z9119 Patient's noncompliance with other medical treatment and regimen: Secondary | ICD-10-CM | POA: Insufficient documentation

## 2018-01-07 DIAGNOSIS — F22 Delusional disorders: Secondary | ICD-10-CM

## 2018-01-07 DIAGNOSIS — F0391 Unspecified dementia with behavioral disturbance: Secondary | ICD-10-CM | POA: Insufficient documentation

## 2018-01-07 DIAGNOSIS — G3184 Mild cognitive impairment, so stated: Secondary | ICD-10-CM | POA: Insufficient documentation

## 2018-01-07 DIAGNOSIS — E119 Type 2 diabetes mellitus without complications: Secondary | ICD-10-CM | POA: Insufficient documentation

## 2018-01-07 DIAGNOSIS — R451 Restlessness and agitation: Secondary | ICD-10-CM | POA: Insufficient documentation

## 2018-01-07 DIAGNOSIS — R45850 Homicidal ideations: Secondary | ICD-10-CM | POA: Insufficient documentation

## 2018-01-07 DIAGNOSIS — F03918 Unspecified dementia, unspecified severity, with other behavioral disturbance: Secondary | ICD-10-CM

## 2018-01-07 DIAGNOSIS — I2510 Atherosclerotic heart disease of native coronary artery without angina pectoris: Secondary | ICD-10-CM | POA: Insufficient documentation

## 2018-01-07 DIAGNOSIS — R739 Hyperglycemia, unspecified: Secondary | ICD-10-CM

## 2018-01-07 DIAGNOSIS — Z87891 Personal history of nicotine dependence: Secondary | ICD-10-CM | POA: Diagnosis not present

## 2018-01-07 LAB — CBC WITH DIFFERENTIAL/PLATELET
BASOS PCT: 1 %
Basophils Absolute: 0.1 10*3/uL (ref 0.0–0.1)
EOS ABS: 0.1 10*3/uL (ref 0.0–0.7)
EOS PCT: 2 %
HCT: 50.2 % (ref 39.0–52.0)
HEMOGLOBIN: 17.6 g/dL — AB (ref 13.0–17.0)
LYMPHS ABS: 1.9 10*3/uL (ref 0.7–4.0)
Lymphocytes Relative: 24 %
MCH: 32.7 pg (ref 26.0–34.0)
MCHC: 35.1 g/dL (ref 30.0–36.0)
MCV: 93.1 fL (ref 78.0–100.0)
Monocytes Absolute: 0.6 10*3/uL (ref 0.1–1.0)
Monocytes Relative: 7 %
Neutro Abs: 5.5 10*3/uL (ref 1.7–7.7)
Neutrophils Relative %: 66 %
PLATELETS: 217 10*3/uL (ref 150–400)
RBC: 5.39 MIL/uL (ref 4.22–5.81)
RDW: 12.3 % (ref 11.5–15.5)
WBC: 8.2 10*3/uL (ref 4.0–10.5)

## 2018-01-07 LAB — BASIC METABOLIC PANEL
Anion gap: 10 (ref 5–15)
BUN: 7 mg/dL (ref 6–20)
CHLORIDE: 92 mmol/L — AB (ref 101–111)
CO2: 31 mmol/L (ref 22–32)
CREATININE: 1.01 mg/dL (ref 0.61–1.24)
Calcium: 9.7 mg/dL (ref 8.9–10.3)
Glucose, Bld: 306 mg/dL — ABNORMAL HIGH (ref 65–99)
POTASSIUM: 3.8 mmol/L (ref 3.5–5.1)
SODIUM: 133 mmol/L — AB (ref 135–145)

## 2018-01-07 LAB — URINALYSIS, ROUTINE W REFLEX MICROSCOPIC
BACTERIA UA: NONE SEEN
BILIRUBIN URINE: NEGATIVE
Glucose, UA: >=500 mg/dL — AB
KETONES UR: NEGATIVE mg/dL
Leukocytes, UA: NEGATIVE
NITRITE: NEGATIVE
PH: 6 (ref 5.0–8.0)
Protein, ur: NEGATIVE mg/dL
Specific Gravity, Urine: 1.01 (ref 1.005–1.030)

## 2018-01-07 LAB — ETHANOL: Alcohol, Ethyl (B): <10 mg/dL (ref <10)

## 2018-01-07 LAB — RAPID URINE DRUG SCREEN, HOSP PERFORMED
AMPHETAMINES: NOT DETECTED
Barbiturates: NOT DETECTED
Benzodiazepines: NOT DETECTED
COCAINE: NOT DETECTED
OPIATES: NOT DETECTED
TETRAHYDROCANNABINOL: NOT DETECTED

## 2018-01-07 MED ORDER — QUETIAPINE FUMARATE 25 MG PO TABS
25.0000 mg | ORAL_TABLET | Freq: Every day | ORAL | Status: DC
  Administered 2018-01-08 – 2018-01-09 (×3): 25 mg via ORAL
  Filled 2018-01-07 (×3): qty 1

## 2018-01-07 MED ORDER — METFORMIN HCL 500 MG PO TABS
500.0000 mg | ORAL_TABLET | Freq: Two times a day (BID) | ORAL | Status: DC
  Administered 2018-01-07 – 2018-01-08 (×2): 500 mg via ORAL
  Filled 2018-01-07 (×2): qty 1

## 2018-01-07 MED ORDER — STERILE WATER FOR INJECTION IJ SOLN
INTRAMUSCULAR | Status: AC
  Filled 2018-01-07: qty 10

## 2018-01-07 MED ORDER — ZIPRASIDONE MESYLATE 20 MG IM SOLR
INTRAMUSCULAR | Status: AC
  Administered 2018-01-07: 20 mg via INTRAMUSCULAR
  Filled 2018-01-07: qty 20

## 2018-01-07 MED ORDER — ZIPRASIDONE MESYLATE 20 MG IM SOLR
20.0000 mg | Freq: Once | INTRAMUSCULAR | Status: AC
  Administered 2018-01-07: 20 mg via INTRAMUSCULAR

## 2018-01-07 NOTE — ED Notes (Signed)
Pt calmer at this time, but is still requesting to leave.  Remains handcuffed with officer at bedside.

## 2018-01-07 NOTE — Progress Notes (Addendum)
Referred pt to the following hospitals seeking inpatient ger-psych treatment: [facility]- working to verify [employer], placed on waiting list [place], MSW, [place] Work 01/07/2018 [phone]
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
## 2018-01-07 NOTE — ED Notes (Signed)
Pt continues to ask why he is here and when can he leave.  No reasoning or explaining to pt.  Is cooperative at this time.

## 2018-01-07 NOTE — ED Notes (Signed)
Pt calm and cooperative at this time. Pt seems to forget where he is at and why he is handcuffed at times. When he remembers pt begins to get agitated again. Pt is easily redirected.
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
## 2018-01-07 NOTE — ED Notes (Signed)
Pt continually calm and cooperative

## 2018-01-07 NOTE — ED Notes (Signed)
Pts shirt, pants, shoes, and coat locked in locker.

## 2018-01-07 NOTE — ED Notes (Signed)
Pt given dinner tray and assisted with eating at this time
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
## 2018-01-07 NOTE — ED Notes (Signed)
Received report on pt, pt lying on right side, with eyes closed, pt will occasionally pull at his cover, sitter remains at bedside,

## 2018-01-07 NOTE — ED Notes (Signed)
TTS in progress.  Wife at bedside.
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
## 2018-01-08 ENCOUNTER — Emergency Department (HOSPITAL_COMMUNITY): Payer: Medicare Other

## 2018-01-08 LAB — CBG MONITORING, ED
GLUCOSE-CAPILLARY: 275 mg/dL — AB (ref 65–99)
GLUCOSE-CAPILLARY: 217 mg/dL — AB (ref 65–99)
Glucose-Capillary: 264 mg/dL — ABNORMAL HIGH (ref 65–99)
Glucose-Capillary: 285 mg/dL — ABNORMAL HIGH (ref 65–99)

## 2018-01-08 MED ORDER — METFORMIN HCL 500 MG PO TABS
1000.0000 mg | ORAL_TABLET | Freq: Two times a day (BID) | ORAL | Status: DC
  Administered 2018-01-08 – 2018-01-10 (×4): 1000 mg via ORAL
  Filled 2018-01-08 (×4): qty 2

## 2018-01-08 MED ORDER — INSULIN ASPART 100 UNIT/ML ~~LOC~~ SOLN
0.0000 [IU] | Freq: Every day | SUBCUTANEOUS | Status: DC
  Administered 2018-01-08: 2 [IU] via SUBCUTANEOUS
  Filled 2018-01-08: qty 1

## 2018-01-08 MED ORDER — INSULIN ASPART 100 UNIT/ML ~~LOC~~ SOLN
0.0000 [IU] | Freq: Three times a day (TID) | SUBCUTANEOUS | Status: DC
  Administered 2018-01-08: 5 [IU] via SUBCUTANEOUS
  Administered 2018-01-09: 2 [IU] via SUBCUTANEOUS
  Administered 2018-01-09 (×2): 3 [IU] via SUBCUTANEOUS
  Administered 2018-01-10: 2 [IU] via SUBCUTANEOUS
  Filled 2018-01-08 (×5): qty 1

## 2018-01-08 NOTE — ED Notes (Signed)
PTs family visiting

## 2018-01-08 NOTE — ED Notes (Signed)
Pt received lunch tray 

## 2018-01-08 NOTE — ED Notes (Signed)
Pt wife & son is visiting with pt

## 2018-01-08 NOTE — ED Notes (Signed)
Pt ambulatory to restroom, tolerated well, update given to pt, sitter remains at bedside, pt placed on hospital bed for comfort,

## 2018-01-08 NOTE — BH Assessment (Signed)
Re-assessment:   Patient re-assessed this day. Patient was able to report he's in the hospital, however, he was unable to report why he's in the hospital. Patient later report he's in the hospital due to memory problems, present a little over a year. Report he's memory problems was causing a disturbance in his life, but acknowledges he's not presently having any problems. Patient report he's on medication and takes it as prescribed. Patient unable to answer question concerning auditory / visual hallucinations, expressing he cannot remember. Denies SI and HI. Patient acknowledges when he does have issues it's related to his memory lost.   Per note, patient is suffering with untreated dementia.  Disposition: Patient continues to meet inpatient criteria.

## 2018-01-08 NOTE — ED Notes (Signed)
Pt resting supine, resp even and nonlabored, sitter remains at bedside,

## 2018-01-08 NOTE — ED Notes (Signed)
Meal tray given 

## 2018-01-08 NOTE — ED Notes (Signed)
PT standing in room door way repetitively asking when his wife will be here.

## 2018-01-08 NOTE — Progress Notes (Signed)
Inpatient Diabetes Program Recommendations  AACE/ADA: New Consensus Statement on Inpatient Glycemic Control (2015)  Target Ranges:  Prepandial:   less than 140 mg/dL      Peak postprandial:   less than 180 mg/dL (1-2 hours)      Critically ill patients:  140 - 180 mg/dL  Results for Joshua Khan, Joshua Khan (MRN [id number]) as of 01/08/2018 10:08  Ref. Range 01/08/2018 09:18  Glucose-Capillary Latest Ref Range: 65 - 99 mg/dL 264 (H)   Results for Joshua Khan, Joshua Khan (MRN [id number]) as of 01/08/2018 10:08  Ref. Range 01/07/2018 11:06  Glucose Latest Ref Range: 65 - 99 mg/dL 306 (H)  Results for Joshua Khan, Joshua Khan (MRN [id number]) as of 01/08/2018 10:08  Ref. Range 01/11/2017 09:15  Hemoglobin A1C Latest Ref Range: <5.7 % 9.7 (H)   Review of Glycemic Control  Diabetes history: DM2 Outpatient Diabetes medications: Metformin 500 mg BID Current orders for Inpatient glycemic control: Metformin 500 mg BID  Inpatient Diabetes Program Recommendations: Correction (SSI): While inpatient, please consider ordering CBGs with Novolog sensitive correction scale 0-9 units TID with meals and Novolog 0-5 units QHS. Oral Agents: Please consider increasing Metformin to 1000 mg BID and consider starting Amaryl 2 mg daily as well. A1C: Please consider ordering an A1C to evaluate glycemic control over the past 2-3 months.  Thanks, Barnie Alderman, RN, MSN, CDE Diabetes Coordinator Inpatient Diabetes Program [phone] (Team Pager from 8am to 5pm)
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
## 2018-01-08 NOTE — ED Notes (Signed)
Pt ambulated to restroom with sitter. Pt given breakfast tray. Pt up & eating.

## 2018-01-08 NOTE — Progress Notes (Signed)
Pt. meets criteria for inpatient treatment per Marvia Pickles, NP.  Referred out to the following gero[facility]psych hospitals x's 2:  [facility]       Disposition CSW will continue to follow for placement.  Areatha Keas. Judi Cong, MSW, [place] Disposition Clinical Social Work [phone] (cell) [phone] (office)

## 2018-01-09 ENCOUNTER — Encounter (HOSPITAL_COMMUNITY): Payer: Self-pay | Admitting: Registered Nurse

## 2018-01-09 LAB — CBG MONITORING, ED
GLUCOSE-CAPILLARY: 232 mg/dL — AB (ref 65–99)
GLUCOSE-CAPILLARY: 199 mg/dL — AB (ref 65–99)
Glucose-Capillary: 226 mg/dL — ABNORMAL HIGH (ref 65–99)
Glucose-Capillary: 199 mg/dL — ABNORMAL HIGH (ref 65–99)

## 2018-01-09 MED ORDER — GI COCKTAIL ~~LOC~~
30.0000 mL | Freq: Once | ORAL | Status: AC
  Administered 2018-01-09: 30 mL via ORAL
  Filled 2018-01-09: qty 30

## 2018-01-09 NOTE — ED Notes (Signed)
Pt awake asking for wife.
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
## 2018-01-09 NOTE — Progress Notes (Signed)
Inpatient Diabetes Program Recommendations  AACE/ADA: New Consensus Statement on Inpatient Glycemic Control (2019)  Target Ranges:  Prepandial:   less than 140 mg/dL      Peak postprandial:   less than 180 mg/dL (1-2 hours)      Critically ill patients:  140 - 180 mg/dL   Results for MONTY, SPICHER (MRN [id number]) as of 01/09/2018 10:59  Ref. Range 01/08/2018 09:18 01/08/2018 12:39 01/08/2018 16:34 01/08/2018 20:50 01/09/2018 07:32  Glucose-Capillary Latest Ref Range: 65 - 99 mg/dL 264 (H) 275 (H) 285 (H) 217 (H) 226 (H)   Review of Glycemic Control  Diabetes history: DM2 Outpatient Diabetes medications: Metformin 500 mg BID Current orders for Inpatient glycemic control: Metformin 1000 mg BID, Novolog 0-9 units TID with meals, Novolog 0-5 units QHS  Inpatient Diabetes Program Recommendations: Oral Agents: Please consider starting Amaryl 2 mg daily.  Thanks, Barnie Alderman, RN, MSN, CDE Diabetes Coordinator Inpatient Diabetes Program [phone] (Team Pager from 8am to 5pm)
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
## 2018-01-09 NOTE — ED Notes (Signed)
Sitter at bedside.

## 2018-01-09 NOTE — ED Notes (Signed)
Wife no longer at beside, pt very restless and slightly agitated asking for doctor.

## 2018-01-09 NOTE — ED Notes (Signed)
Pt eating breakfast, wife at bedside.

## 2018-01-09 NOTE — ED Notes (Signed)
Pt restless, more agitated.

## 2018-01-09 NOTE — Progress Notes (Signed)
Pt is psychiatrically cleared.  Disposition CSW contacted [facility] ED CSW, Esau Grew. and asked if she could provide Memory Care/ALF and community resources to pt's family for assistance with dementia care.  [facility] CSW relates that she will be able to do so shortly before 4 PM today.  CSW called and left message notifying pt's ED Nurse, Gerald Stabs.  Areatha Keas. Judi Cong, MSW, [place] Disposition Clinical Social Work [phone] (cell) [phone] (office)

## 2018-01-09 NOTE — ED Notes (Signed)
Wife at bedside.

## 2018-01-09 NOTE — Clinical Social Work Note (Signed)
LCSW spoke with patient's son, Joshua Khan (via phone), and discussed that patient has been cleared by psych. Joshua Khan indicates that patient lives with him, his wife, his two children and patient's wife.  LCSW left a list of memory care facilities in the room with patient and advised Joshua Khan that the family could review them and when ready could have his PCP assist with placement. Joshua Khan stated that patient's PCP was no longer in the area practicing. LCSW discussed that patient would need to establish care with a PCP and seek to address any needed medical treatment for his dementia and ongoing health issues. Joshua Khan stated that he would bring patient's clothes and pick him up since he had been psych cleared.   RN to consult CM to address PCP needs.   LCSW signing off.     Kinney Sackmann, Clydene Pugh, LCSW

## 2018-01-09 NOTE — ED Notes (Signed)
Pt restless, continues to ask for doctor.

## 2018-01-09 NOTE — ED Notes (Signed)
Family at bedside. 

## 2018-01-10 LAB — CBG MONITORING, ED: Glucose-Capillary: 183 mg/dL — ABNORMAL HIGH (ref 65–99)

## 2018-01-10 MED ORDER — METFORMIN HCL 1000 MG PO TABS: 1000.0000 mg | ORAL_TABLET | Freq: Two times a day (BID) | ORAL | 2 refills | Status: AC

## 2018-01-10 MED ORDER — QUETIAPINE FUMARATE 25 MG PO TABS: 25.0000 mg | ORAL_TABLET | Freq: Every day | ORAL | 2 refills | Status: AC

## 2018-01-10 NOTE — ED Notes (Signed)
Pt resting comfortably in bed. Pt is calm, cooperative and pleasant. Pt does not remember why he is here, and states he wants to go home to his wife.

## 2018-01-10 NOTE — Progress Notes (Signed)
Received call from [place] ED Charge Nurse.  Related that pt was reassessed last night and, while it was recommended that he be observed overnight and reassessed by psychiatry this morning, he will not be as he was psych cleared yesterday.  It is evident that this patient has dementia and his aggressive behaviors are related to that diagnosis.  Patient referral information was sent out numerous times and patient was not picked up by any gero-psych hospitals because there was no evidence of a psychiatric illness. Family has already been counseled to seek guardianship and acknowledged that process was going to be started when this Probation officer spoke to his son, Joshua Khan., last week. Family has also been counseled to see memory care facility placement and has been given a list of resources. Patient is in the care of family at this time and has a safe discharge plan.  Areatha Keas. Judi Cong, MSW, [place] Disposition Clinical Social Work [phone] (cell) [phone] (office)
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
## 2018-01-10 NOTE — Discharge Instructions (Addendum)
Follow-up with your family doctor in the next couple weeks.  And follow-up as instructed by behavioral health

## 2018-02-28 ENCOUNTER — Other Ambulatory Visit (HOSPITAL_COMMUNITY)
Admission: RE | Admit: 2018-02-28 | Discharge: 2018-02-28 | Disposition: A | Discharge status: Home / Self Care | Payer: Medicare Other | Source: Ambulatory Visit | Attending: Internal Medicine | Admitting: Internal Medicine

## 2018-02-28 DIAGNOSIS — Z0001 Encounter for general adult medical examination with abnormal findings: Secondary | ICD-10-CM | POA: Diagnosis present

## 2018-02-28 DIAGNOSIS — E785 Hyperlipidemia, unspecified: Secondary | ICD-10-CM | POA: Insufficient documentation

## 2018-02-28 DIAGNOSIS — Z13228 Encounter for screening for other metabolic disorders: Secondary | ICD-10-CM | POA: Insufficient documentation

## 2018-02-28 DIAGNOSIS — Z13820 Encounter for screening for osteoporosis: Secondary | ICD-10-CM | POA: Insufficient documentation

## 2018-02-28 DIAGNOSIS — E118 Type 2 diabetes mellitus with unspecified complications: Secondary | ICD-10-CM | POA: Insufficient documentation

## 2018-02-28 DIAGNOSIS — Z125 Encounter for screening for malignant neoplasm of prostate: Secondary | ICD-10-CM | POA: Insufficient documentation

## 2018-02-28 LAB — CBC WITH DIFFERENTIAL/PLATELET
BASOS PCT: 1 %
Basophils Absolute: 0 10*3/uL (ref 0.0–0.1)
EOS ABS: 0.1 10*3/uL (ref 0.0–0.7)
EOS PCT: 2 %
HCT: 48.2 % (ref 39.0–52.0)
Hemoglobin: 16.1 g/dL (ref 13.0–17.0)
LYMPHS ABS: 1.6 10*3/uL (ref 0.7–4.0)
Lymphocytes Relative: 25 %
MCH: 32.3 pg (ref 26.0–34.0)
MCHC: 33.4 g/dL (ref 30.0–36.0)
MCV: 96.6 fL (ref 78.0–100.0)
MONOS PCT: 8 %
Monocytes Absolute: 0.5 10*3/uL (ref 0.1–1.0)
Neutro Abs: 4.2 10*3/uL (ref 1.7–7.7)
Neutrophils Relative %: 66 %
PLATELETS: 174 10*3/uL (ref 150–400)
RBC: 4.99 MIL/uL (ref 4.22–5.81)
RDW: 13.6 % (ref 11.5–15.5)
WBC: 6.4 10*3/uL (ref 4.0–10.5)

## 2018-02-28 LAB — URINALYSIS, ROUTINE W REFLEX MICROSCOPIC
Bilirubin Urine: NEGATIVE
Glucose, UA: >=500 mg/dL — AB
HGB URINE DIPSTICK: NEGATIVE
KETONES UR: NEGATIVE mg/dL
Leukocytes, UA: NEGATIVE
Nitrite: NEGATIVE
PH: 6 (ref 5.0–8.0)
Protein, ur: NEGATIVE mg/dL

## 2018-02-28 LAB — COMPREHENSIVE METABOLIC PANEL
ALBUMIN: 3.8 g/dL (ref 3.5–5.0)
ALT: 41 U/L (ref 0–44)
ANION GAP: 11 (ref 5–15)
AST: 47 U/L — AB (ref 15–41)
Alkaline Phosphatase: 75 U/L (ref 38–126)
BILIRUBIN TOTAL: 0.7 mg/dL (ref 0.3–1.2)
BUN: 7 mg/dL — AB (ref 8–23)
CO2: 26 mmol/L (ref 22–32)
Calcium: 9.3 mg/dL (ref 8.9–10.3)
Chloride: 99 mmol/L (ref 98–111)
Creatinine, Ser: 0.92 mg/dL (ref 0.61–1.24)
GFR calc Af Amer: >60 mL/min (ref >60)
GFR calc non Af Amer: >60 mL/min (ref >60)
GLUCOSE: 248 mg/dL — AB (ref 70–99)
POTASSIUM: 3.5 mmol/L (ref 3.5–5.1)
Sodium: 136 mmol/L (ref 135–145)
TOTAL PROTEIN: 7.7 g/dL (ref 6.5–8.1)

## 2018-02-28 LAB — LIPID PANEL
CHOL/HDL RATIO: 5.4 ratio
Cholesterol: 199 mg/dL (ref 0–200)
HDL: 37 mg/dL — AB (ref >40)
LDL Cholesterol: 117 mg/dL — ABNORMAL HIGH (ref 0–99)
Triglycerides: 226 mg/dL — ABNORMAL HIGH (ref <150)
VLDL: 45 mg/dL — AB (ref 0–40)

## 2018-02-28 LAB — URINALYSIS, MICROSCOPIC (REFLEX)
Bacteria, UA: NONE SEEN
RBC / HPF: NONE SEEN RBC/hpf (ref 0–5)
WBC, UA: NONE SEEN WBC/hpf (ref 0–5)

## 2018-02-28 LAB — HEMOGLOBIN A1C
HEMOGLOBIN A1C: 9.1 % — AB (ref 4.8–5.6)
Mean Plasma Glucose: 214.47 mg/dL

## 2018-02-28 LAB — TSH: TSH: 1.491 u[IU]/mL (ref 0.350–4.500)

## 2018-02-28 LAB — PSA: Prostatic Specific Antigen: 1.69 ng/mL (ref 0.00–4.00)

## 2018-03-01 LAB — VITAMIN D 25 HYDROXY (VIT D DEFICIENCY, FRACTURES): VIT D 25 HYDROXY: 22.3 ng/mL — AB (ref 30.0–100.0)

## 2018-06-05 ENCOUNTER — Emergency Department (HOSPITAL_COMMUNITY): Payer: Medicare Other

## 2018-06-05 ENCOUNTER — Encounter (HOSPITAL_COMMUNITY): Payer: Self-pay | Admitting: *Deleted

## 2018-06-05 ENCOUNTER — Other Ambulatory Visit: Payer: Self-pay

## 2018-06-05 ENCOUNTER — Emergency Department (HOSPITAL_COMMUNITY)
Admission: EM | Admit: 2018-06-05 | Discharge: 2018-06-09 | Disposition: A | Discharge status: Home / Self Care | Payer: Medicare Other | Attending: Emergency Medicine | Admitting: Emergency Medicine

## 2018-06-05 DIAGNOSIS — Z79899 Other long term (current) drug therapy: Secondary | ICD-10-CM | POA: Diagnosis not present

## 2018-06-05 DIAGNOSIS — R443 Hallucinations, unspecified: Secondary | ICD-10-CM

## 2018-06-05 DIAGNOSIS — Z046 Encounter for general psychiatric examination, requested by authority: Secondary | ICD-10-CM | POA: Diagnosis present

## 2018-06-05 DIAGNOSIS — Z7984 Long term (current) use of oral hypoglycemic drugs: Secondary | ICD-10-CM | POA: Diagnosis not present

## 2018-06-05 DIAGNOSIS — E119 Type 2 diabetes mellitus without complications: Secondary | ICD-10-CM | POA: Diagnosis not present

## 2018-06-05 DIAGNOSIS — F0391 Unspecified dementia with behavioral disturbance: Secondary | ICD-10-CM | POA: Insufficient documentation

## 2018-06-05 DIAGNOSIS — I259 Chronic ischemic heart disease, unspecified: Secondary | ICD-10-CM | POA: Insufficient documentation

## 2018-06-05 DIAGNOSIS — Z87891 Personal history of nicotine dependence: Secondary | ICD-10-CM | POA: Diagnosis not present

## 2018-06-05 LAB — COMPREHENSIVE METABOLIC PANEL
ALBUMIN: 4.3 g/dL (ref 3.5–5.0)
ALT: 59 U/L — ABNORMAL HIGH (ref 0–44)
AST: 56 U/L — ABNORMAL HIGH (ref 15–41)
Alkaline Phosphatase: 89 U/L (ref 38–126)
Anion gap: 10 (ref 5–15)
BUN: <5 mg/dL — ABNORMAL LOW (ref 8–23)
CO2: 23 mmol/L (ref 22–32)
Calcium: 9.2 mg/dL (ref 8.9–10.3)
Chloride: 98 mmol/L (ref 98–111)
Creatinine, Ser: 0.9 mg/dL (ref 0.61–1.24)
GFR calc Af Amer: >60 mL/min (ref >60)
GFR calc non Af Amer: >60 mL/min (ref >60)
GLUCOSE: 256 mg/dL — AB (ref 70–99)
POTASSIUM: 3.7 mmol/L (ref 3.5–5.1)
SODIUM: 131 mmol/L — AB (ref 135–145)
Total Bilirubin: 0.7 mg/dL (ref 0.3–1.2)
Total Protein: 8.3 g/dL — ABNORMAL HIGH (ref 6.5–8.1)

## 2018-06-05 LAB — URINALYSIS, ROUTINE W REFLEX MICROSCOPIC
BILIRUBIN URINE: NEGATIVE
Bacteria, UA: NONE SEEN
Glucose, UA: >=500 mg/dL — AB
KETONES UR: NEGATIVE mg/dL
Leukocytes, UA: NEGATIVE
NITRITE: NEGATIVE
PH: 6 (ref 5.0–8.0)
Protein, ur: NEGATIVE mg/dL
Specific Gravity, Urine: 1.006 (ref 1.005–1.030)

## 2018-06-05 LAB — CBC WITH DIFFERENTIAL/PLATELET
ABS IMMATURE GRANULOCYTES: 0.02 10*3/uL (ref 0.00–0.07)
BASOS ABS: 0.1 10*3/uL (ref 0.0–0.1)
Basophils Relative: 1 %
Eosinophils Absolute: 0.1 10*3/uL (ref 0.0–0.5)
Eosinophils Relative: 1 %
HCT: 48.1 % (ref 39.0–52.0)
Hemoglobin: 16.1 g/dL (ref 13.0–17.0)
Immature Granulocytes: 0 %
LYMPHS PCT: 24 %
Lymphs Abs: 2.1 10*3/uL (ref 0.7–4.0)
MCH: 32.3 pg (ref 26.0–34.0)
MCHC: 33.5 g/dL (ref 30.0–36.0)
MCV: 96.4 fL (ref 80.0–100.0)
Monocytes Absolute: 0.8 10*3/uL (ref 0.1–1.0)
Monocytes Relative: 9 %
NEUTROS ABS: 5.7 10*3/uL (ref 1.7–7.7)
NRBC: 0 % (ref 0.0–0.2)
Neutrophils Relative %: 65 %
Platelets: 240 10*3/uL (ref 150–400)
RBC: 4.99 MIL/uL (ref 4.22–5.81)
RDW: 12.2 % (ref 11.5–15.5)
WBC: 8.7 10*3/uL (ref 4.0–10.5)

## 2018-06-05 LAB — RAPID URINE DRUG SCREEN, HOSP PERFORMED
Amphetamines: NOT DETECTED
Barbiturates: NOT DETECTED
Benzodiazepines: NOT DETECTED
Cocaine: NOT DETECTED
Opiates: NOT DETECTED
Tetrahydrocannabinol: NOT DETECTED

## 2018-06-05 LAB — TROPONIN I

## 2018-06-05 LAB — SALICYLATE LEVEL: Salicylate Lvl: <7 mg/dL (ref 2.8–30.0)

## 2018-06-05 LAB — ETHANOL: Alcohol, Ethyl (B): <10 mg/dL (ref <10)

## 2018-06-05 LAB — ACETAMINOPHEN LEVEL: Acetaminophen (Tylenol), Serum: <10 ug/mL — ABNORMAL LOW (ref 10–30)

## 2018-06-05 MED ORDER — METFORMIN HCL 500 MG PO TABS: 1000.0000 mg | ORAL_TABLET | Freq: Once | ORAL | Status: DC

## 2018-06-05 MED ORDER — QUETIAPINE FUMARATE 25 MG PO TABS
25.0000 mg | ORAL_TABLET | Freq: Every day | ORAL | Status: DC
  Administered 2018-06-05 – 2018-06-08 (×4): 25 mg via ORAL
  Filled 2018-06-05 (×4): qty 1

## 2018-06-05 MED ORDER — METFORMIN HCL 500 MG PO TABS
1000.0000 mg | ORAL_TABLET | Freq: Two times a day (BID) | ORAL | Status: DC
  Administered 2018-06-05 – 2018-06-08 (×7): 1000 mg via ORAL
  Filled 2018-06-05 (×7): qty 2

## 2018-06-05 MED ORDER — PANTOPRAZOLE SODIUM 40 MG PO TBEC
80.0000 mg | DELAYED_RELEASE_TABLET | Freq: Every day | ORAL | Status: DC
  Administered 2018-06-06 – 2018-06-08 (×3): 80 mg via ORAL
  Filled 2018-06-05 (×4): qty 2

## 2018-06-05 MED ORDER — MEMANTINE HCL 5 MG PO TABS
5.0000 mg | ORAL_TABLET | Freq: Two times a day (BID) | ORAL | Status: DC
  Administered 2018-06-05 – 2018-06-08 (×7): 5 mg via ORAL
  Filled 2018-06-05 (×12): qty 1

## 2018-06-05 NOTE — ED Notes (Signed)
Patient transported to X-ray 

## 2018-06-05 NOTE — ED Notes (Signed)
TTS assessment complete.

## 2018-06-05 NOTE — BH Assessment (Signed)
Tele Assessment Note   Patient Name: Joshua Khan MRN: [id number] Referring Physician: Dr. Thurnell Garbe [facility] of [facility]: [facility] Bed: [facility] Location of Provider: [place] is an 72 y.o. male presenting with hx of dementia. Patient is under IVC due to threats to kill his wife and his daughter and her husband. Throughout assessment, patient stated that his mind wasn't good, therefore he couldn't remember certain things. Patient was not able to recall names of his daughter and grandaughter that was in the room with him. Patient stated, "nothings wrong with me, my mind just can't remember everything". Patient became upset that his daughter was stating that he wanted to harm his wife. Per IVC, wife is petitioner, patient hasn't been taking care of himself. Patient has been making comments he wants to be with Jesus and he sees angels and hears voices. Patient has thrx to kill members of the family. UDS and ETOH negative.  Per EDP note: The history is limited by the condition of the patient (Hx dementia).  Pt was seen at 2115.  Per Police, pt's family, IVC paperwork and pt: Pt brought to ED under IVC taken out by his family. IVC paperwork states: pt has been "using very aggressive behavior with his wife and his grandchildren and well as whole family. He is seeing people who are not there. He says he sees angles and talks to them." Pt "hears voices as well; said he would cut his own throat before he done something bad to his wife." Pt "recently he had episode where he did not know who he was or anyone else, said he would be better off dead, to go home not be with Jesus." Pt "eating habits is suffering, losing weight, not bathing properly, personal hygiene suffering. Pt not taking his medication."  Pt himself states he "is fine" and all of what is family is claiming "happened a while ago" and he is "cured after Jesus came down."    Disposition: Lindon Romp, NP, patient meets  inpatient criteria. TTS to seek placement. Doctor and RN informed of disposition.  Diagnosis: hx Dementia  Past Medical History:  Past Medical History:  Diagnosis Date  . Allergy   . Anxiety    yes here latley  , no meds  . Aortic root dilatation (HCC)    Mild, echo, April, 2014  . Arthritis    hands  . Barrett's esophagus   . Blood transfusion    as child  . CAD (coronary artery disease)    Nonobstructive coronary disease by cath, 2007  . Cancer ([place])    basal ceel removed ,  one on head and one on right arm  . Cervical radiculopathy   . Chronic back pain   . Chronic neck pain   . Dementia ([place])   . Diabetes mellitus    ,  poor circulation  . Ejection fraction    EF 60%, catheterization, 2007  //   EF 60%, echo, April, 2014 while in hospital  . GERD (gastroesophageal reflux disease)    takes  prilosec  . Hiatal hernia   . Hypercholesterolemia   . Lumbar radiculopathy   . Memory change    April, 2014, patient is scheduled for neurologic testing Jan 25, 2013  . Pneumonia    3 times as baby  . Poor short term memory    on Aricept  . Pre-syncope    May, 2015  . S/P colonoscopy    ? awaiting  records?  . S/P endoscopy August 2007   Dr. Oneida Alar: chronic active gastritis, +H.pylori, ? treatment?, short-segment Barrett's  . Seizures ([place])   . Sinus bradycardia    Asymptomatic in 2007  . Sleep apnea    yes c pap  . SVT (supraventricular tachycardia) (HCC)    5 beats atrial tachycardia 48 hour Holter, May, 2014, no atrial fibrillation, no atrial flutter.    Past Surgical History:  Procedure Laterality Date  . ANAL FISSURE REPAIR    . COLONOSCOPY  06/29/11   internal hemorrhoids/severe diverticulosis, path negative for microscopic colitis repeat Oct 2017 with 2 days of clear liquids and Moviprep  . ESOPHAGOGASTRODUODENOSCOPY  06/29/11   barrett's esophagus/hiatal hernia/mild gastristis, s/p Savary dilation, path with no sprue, mild chronic gastritis noted, negative  H.pylori, intestinal metaplasia consistent with barrett's, no dysplasia  . EXTERNAL EAR SURGERY    . HAND SURGERY    . NECK SURGERY    . skin cancer removal    . TONSILLECTOMY     30's  . TRIGGER FINGER RELEASE Right 10/29/2015   Procedure: RELEASE RIGHT MIDDLE FINGER A-1 PULLEY, MANIPULATION PROXIMAL IPJ;  Surgeon: Daryll Brod, MD;  Location: [place];  Service: Orthopedics;  Laterality: Right;  ANESTHESIA: IV REGIONAL FAB   . VASECTOMY      Family History:  Family History  Problem Relation Age of Onset  . Heart disease Mother   . High blood pressure Father   . Diabetes type II Father   . Diabetes Father   . Kidney disease Father   . Cancer Sister        breast cancer  . COPD Son   . Cancer Brother        lung  . Colon cancer Neg Hx     Social History:  reports that he has quit smoking. His smoking use included cigarettes. He has never used smokeless tobacco. He reports that he does not drink alcohol or use drugs.  Additional Social History:  Alcohol / Drug Use Pain Medications: see MAR Prescriptions: see MAR Over the Counter: see MAR  CIWA: CIWA-Ar BP: (!) 167/113 Pulse Rate: 99 COWS:    Allergies:  Allergies  Allergen Reactions  . Aspirin     Cannot take if not coated, makes stomach burn  . Codeine     REACTION: UNKNOWN REACTION  . Sulfa Antibiotics     Not sure of reaction  . Tomato Other (See Comments)    "feels like having heart attack" after eating    Home Medications:  (Not in a hospital admission)  OB/GYN Status:  No LMP for male patient.  General Assessment Data Location of Assessment: [facility] TTS Assessment: In system Is this a Tele or Face-to-Face Assessment?: Tele Assessment Is this an Initial Assessment or a Re-assessment for this encounter?: Initial Assessment Patient Accompanied by:: Other(daughter and grandaughter) Language Other than English: No Living Arrangements: (family home) What gender do you identify as?:  Male Marital status: Married Pregnancy Status: No Living Arrangements: Spouse/significant other, Children, Other relatives Can pt return to current living arrangement?: Yes Admission Status: Involuntary Petitioner: Other(wife) Referral Source: Self/Family/Friend     Crisis Care Plan Living Arrangements: Spouse/significant other, Children, Other relatives Legal Guardian: (self) Name of Psychiatrist: (none) Name of Therapist: (none)  Education Status Is patient currently in school?: No Is the patient employed, unemployed or receiving disability?: (retired)  Risk to self with the past 6 months Suicidal Ideation: No Has patient been a risk  to self within the past 6 months prior to admission? : No Suicidal Intent: No Has patient had any suicidal intent within the past 6 months prior to admission? : No Is patient at risk for suicide?: No Suicidal Plan?: No Has patient had any suicidal plan within the past 6 months prior to admission? : No Access to Means: No What has been your use of drugs/alcohol within the last 12 months?: (none) Previous Attempts/Gestures: No Other Self Harm Risks: (none) Triggers for Past Attempts: (n/a) Intentional Self Injurious Behavior: None Family Suicide History: No Recent stressful life event(s): (dementia) Persecutory voices/beliefs?: No Depression: No Substance abuse history and/or treatment for substance abuse?: No  Risk to Others within the past 6 months Homicidal Ideation: No(denied) Does patient have any lifetime risk of violence toward others beyond the six months prior to admission? : No(denied) Thoughts of Harm to Others: No Current Homicidal Intent: No Current Homicidal Plan: No Access to Homicidal Means: No Identified Victim: (denied) History of harm to others?: No Assessment of Violence: None Noted Does patient have access to weapons?: No Criminal Charges Pending?: No Does patient have a court date: No Is patient on probation?:  No  Psychosis Hallucinations: None noted Delusions: None noted  Mental Status Report Appearance/Hygiene: Unremarkable Eye Contact: Fair Motor Activity: Unremarkable Speech: Logical/coherent Level of Consciousness: Alert, Irritable Mood: Anxious, Irritable Affect: Irritable, Anxious Anxiety Level: Moderate Thought Processes: Coherent Judgement: Impaired(dementia) Orientation: Unable to assess(dementia) Obsessive Compulsive Thoughts/Behaviors: None  Cognitive Functioning Concentration: Fair Memory: (dementia) Is patient IDD: No Insight: Poor(dementia) Impulse Control: Fair Appetite: Fair Have you had any weight changes? : No Change Sleep: No Change Total Hours of Sleep: (8) Vegetative Symptoms: None  ADLScreening [facility] Assessment Services) Patient's cognitive ability adequate to safely complete daily activities?: Yes Patient able to express need for assistance with ADLs?: Yes Independently performs ADLs?: Yes (appropriate for developmental age)  Prior Inpatient Therapy Prior Inpatient Therapy: No  Prior Outpatient Therapy Prior Outpatient Therapy: No Does patient have an ACCT team?: No Does patient have Intensive In-House Services?  : No Does patient have [facility] services? : No Does patient have P4CC services?: No  ADL Screening (condition at time of admission) Patient's cognitive ability adequate to safely complete daily activities?: Yes Patient able to express need for assistance with ADLs?: Yes Independently performs ADLs?: Yes (appropriate for developmental age)             Regulatory affairs officer (For Healthcare) Does Patient Have a Medical Advance Directive?: No          Disposition:  Disposition Initial Assessment Completed for this Encounter: Yes  Lindon Romp, NP, patient meets inpatient criteria. TTS to seek placement. Doctor and RN informed of disposition.  This service was provided via telemedicine using a 2-way, interactive audio and video  technology.  Names of all persons participating in this telemedicine service and their role in this encounter. Name: Joshua Khan Role: patient  Name: Reynolds Bowl Role: daughter  Name: Kirtland Bouchard, [facility] Role: TTS Clinician  Name:  Role:     Venora Maples 06/05/2018 11:58 PM

## 2018-06-05 NOTE — ED Triage Notes (Signed)
Pt brought in by [employer] with IVC paperwork that states pt has dementia and has not been taking care of himself, as far as bathing, the papers state pt has made comments he wants to be with Jesus and he sees angels and hears voices

## 2018-06-05 NOTE — ED Notes (Signed)
BH recommends Geratric Psych impatient placement for patient. IVC papers being faxed to [facility] at this time.

## 2018-06-05 NOTE — ED Notes (Signed)
Patient back from x-ray 

## 2018-06-05 NOTE — ED Notes (Addendum)
Family states that patient became increasingly agressive and agitated(daughter). Daughter stated she was going to leave at this time. Patient allowed to keep clothing per Dr. Thurnell Garbe   To avoid agitation.
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
## 2018-06-05 NOTE — ED Notes (Signed)
Waiting on [facility] to bring other medication.

## 2018-06-06 LAB — GLUCOSE, CAPILLARY
GLUCOSE-CAPILLARY: 189 mg/dL — AB (ref 70–99)
GLUCOSE-CAPILLARY: 233 mg/dL — AB (ref 70–99)

## 2018-06-06 LAB — COMPREHENSIVE METABOLIC PANEL
ALBUMIN: 4.4 g/dL (ref 3.5–5.0)
ALT: 67 U/L — AB (ref 0–44)
AST: 74 U/L — AB (ref 15–41)
Alkaline Phosphatase: 76 U/L (ref 38–126)
Anion gap: 11 (ref 5–15)
BUN: 5 mg/dL — AB (ref 8–23)
CHLORIDE: 95 mmol/L — AB (ref 98–111)
CO2: 26 mmol/L (ref 22–32)
CREATININE: 0.98 mg/dL (ref 0.61–1.24)
Calcium: 9.3 mg/dL (ref 8.9–10.3)
GFR calc Af Amer: >60 mL/min (ref >60)
GLUCOSE: 217 mg/dL — AB (ref 70–99)
Potassium: 4.3 mmol/L (ref 3.5–5.1)
Sodium: 132 mmol/L — ABNORMAL LOW (ref 135–145)
Total Bilirubin: 1.2 mg/dL (ref 0.3–1.2)
Total Protein: 8.5 g/dL — ABNORMAL HIGH (ref 6.5–8.1)

## 2018-06-06 NOTE — Progress Notes (Addendum)
CSW spoke with admissions staff @[facility]. In reviewing pt for acceptance, they ask for a follow up CNP, recent vitals, and his blood sugars for the past 24 hours. Joshua Khan is also asking why pt's liver enzymes are elevated. CSW notified Apolonio Schneiders @ [facility] and has faxed pt's recent vitals.  CSW will continue to coordinate care.   Audree Camel, LCSW, [place] Disposition CSW [facility]/TTS [phone]  Update: All requested medical documentation has been faxed to [facility] for review.

## 2018-06-06 NOTE — ED Notes (Signed)
Patient initially anxious and pacing. Patient calm at this time. Patient alert and oriented but lying in bed.

## 2018-06-06 NOTE — ED Notes (Signed)
Disposition: Lindon Romp, NP, patient meets inpatient criteria. TTS to seek placement. Doctor and RN informed of disposition.

## 2018-06-06 NOTE — ED Notes (Signed)
Patient's family called to check on patient. Patient's daughter left her phone number to contact when patient is placed [phone]

## 2018-06-06 NOTE — Progress Notes (Signed)
Pt. meets criteria for inpatient treatment per Lindon Romp, NP.  Referred out to the following hospitals: [facility]  [facility]     Disposition CSW will continue to follow for placement.  Areatha Keas. Judi Cong, MSW, [place] Disposition Clinical Social Work [phone] (cell) [phone] (office)

## 2018-06-07 ENCOUNTER — Other Ambulatory Visit: Payer: Self-pay

## 2018-06-07 LAB — CBG MONITORING, ED: GLUCOSE-CAPILLARY: 203 mg/dL — AB (ref 70–99)

## 2018-06-07 MED ORDER — LORAZEPAM 0.5 MG PO TABS
0.5000 mg | ORAL_TABLET | Freq: Once | ORAL | Status: AC
  Administered 2018-06-07: 0.5 mg via ORAL
  Filled 2018-06-07: qty 1

## 2018-06-07 MED ORDER — LISINOPRIL 10 MG PO TABS
10.0000 mg | ORAL_TABLET | Freq: Every day | ORAL | Status: DC
  Administered 2018-06-07 – 2018-06-08 (×2): 10 mg via ORAL
  Filled 2018-06-07 (×2): qty 1

## 2018-06-07 MED ORDER — QUETIAPINE FUMARATE 25 MG PO TABS
25.0000 mg | ORAL_TABLET | Freq: Once | ORAL | Status: AC
  Administered 2018-06-07: 25 mg via ORAL
  Filled 2018-06-07: qty 1

## 2018-06-07 NOTE — Progress Notes (Signed)
Inpatient Diabetes Program Recommendations  AACE/ADA: New Consensus Statement on Inpatient Glycemic Control (2015)  Target Ranges:  Prepandial:   less than 140 mg/dL      Peak postprandial:   less than 180 mg/dL (1-2 hours)      Critically ill patients:  140 - 180 mg/dL   Lab Results  Component Value Date   GLUCAP 203 (H) 06/07/2018   HGBA1C 9.1 (H) 02/28/2018    Review of Glycemic Control Results for Joshua Khan, Joshua Khan (MRN [id number]) as of 06/07/2018 13:25  Ref. Range 06/06/2018 15:51 06/07/2018 09:27  Glucose-Capillary Latest Ref Range: 70 - 99 mg/dL 233 (H) 203 (H)   Diabetes history: Type 2 DM Outpatient Diabetes medications: Metformin 1000 mg BID Current orders for Inpatient glycemic control:Metformin 1000 mg BID  Inpatient Diabetes Program Recommendations:    Noted awaiting placement for inpatient psych.   BS in 200's mg/dL. Could consider adding another oral agent while waiting for transfer. Consider adding Glipizide 2.5 mg BID.   Thanks, Bronson Curb, MSN, RNC-OB Diabetes Coordinator [phone] (8a-5p)

## 2018-06-07 NOTE — Progress Notes (Signed)
[facility], Shirlee Limerick, called to request the following in anticipation of possible admission to their hospital:  -Medical explanation for elevated LFT's Per 06/05/18 Initial Medical Assessment by Francine Graven, DO, documents patient's elevated LFT's since 11/24/16 and notes that this is patient's baseline. -Updated vitals-Current BP is elevated, likely due to agitation.  Gerald Stabs, RN, agrees taking vitals again and to documenting current BP. -Updated CBG Current-203  Disposition CSW will await updated vitals and fax information to [facility]. Judi Cong, MSW, [place] Disposition Clinical Social Work [phone] (cell) [phone] (office)

## 2018-06-08 ENCOUNTER — Encounter (HOSPITAL_COMMUNITY): Payer: Self-pay | Admitting: Registered Nurse

## 2018-06-08 NOTE — Clinical Social Work Note (Signed)
Clinical Social Work Assessment  Patient Details  Name: Joshua Khan MRN: [id number] Date of Birth: [birth year]  Date of referral:  06/08/18               Reason for consult:  Facility Placement                Permission sought to share information with:    Permission granted to share information::     Name::        Agency::     Relationship::     Contact Information:  Joshua Khan, Joshua Khan., Son   Housing/Transportation Living arrangements for the past 2 months:  [place] of Information:  Patient, Adult Children Patient Interpreter Needed:  None Criminal Activity/Legal Involvement Pertinent to Current Situation/Hospitalization:  No - Comment as needed Significant Relationships:  Adult Children, Spouse Lives with:  Spouse Do you feel safe going back to the place where you live?  Yes Need for family participation in patient care:  No (Coment)  Care giving concerns: Patient neglect his self care.    Social Worker assessment / plan:  Patient has been cleared by psych. Son, Joshua Khan, indicates that patient refuses to take his medication at home which negatively impacts his behavior. He states that patient will not bathe or groom himself, becomes verbally aggressive and accuses children and his son in law of being in a relationship with his wife (when not on his medication). He states that due to these behaviors, they cannot continue to provide care for patient. Joshua Khan indicated that patient lived with him for about eight months and that patient's behaviors began to upset his 68 year old son. He stated that patient then went to live with his sister and his behaviors were not improved and it only lasted three weeks.  He stated that patient's PCP has been trying to assist them with placement. He indicated that he is working with Joshua Khan at [facility] about long term Medicaid. He indicated that patient will have to be in a SNF and they will have to pay $7000 and patient will then  qualify for long term Medicaid. Family is agreeable to pay the $7000. He stated that he is also working with Joshua Khan at [facility]. LCSW spoke with Joshua Khan at [facility]. She stated that patient does not have an open case and has not had an open case. She stated that she has been trying to assist patient's family with resources, however they continue to run in to barriers.  LCSW met with patient to assess his orientation. Patient was oriented to self. He knew he was in a hospital in [place] because he stated that they had told him this. He did not know the year. When asked who the current president was he stated that he did not talk to him often so he didn't know. Patient stated that he would need to go home but would need [place] to help him get home.  LCSW faxed clinicals to [facility] requesting consideration in their dementia units.     Employment status:  Retired Nurse, adult PT Recommendations:  Not assessed at this time Information / Referral to community resources:     Patient/Family's Response to care:  Family is seeking long term placement.   Patient/Family's Understanding of and Emotional Response to Diagnosis, Current Treatment, and Prognosis:  Patient's family understand their diagnosis, treatment and prognosis and feels long term care is their most  appropriate option.   Emotional Assessment Appearance:  Appears stated age Attitude/Demeanor/Rapport:    Affect (typically observed):  Calm Orientation:  Oriented to Self, Oriented to Place Alcohol / Substance use:  Not Applicable Psych involvement (Current and /or in the community):  No (Comment)  Discharge Needs  Concerns to be addressed:  Discharge Planning Concerns Readmission within the last 30 days:  No Current discharge risk:  Cognitively Impaired Barriers to Discharge:  Other(Finding a facility that will take him with long term Medicaid pending. Family is willing to pay $7000 spend  down. )   Joshua Khan D, LCSW 06/08/2018, 1:08 PM

## 2018-06-08 NOTE — ED Notes (Signed)
Pt's IVC papers rescinded per Dr Carney Bern.  Mauri Brooklyn, Brooke Bonito [facility]) called and informed at [phone].  Mauri Brooklyn, Brooke Bonito to speak with EDP regarding needs at home.

## 2018-06-08 NOTE — ED Notes (Signed)
Pt has been calm and cooperative this shift.  Pt is asking to go home to be with his family.

## 2018-06-08 NOTE — ED Notes (Signed)
Pt is denying HI/SI.  Pt says he want to go home and be with his wife.  Does not remember saying anything to wife or daughter about killing them. He says he love his wife and daughter but is not sure if his wife still loves him.  Denies hearing voice but is aware that he has memory loss.

## 2018-06-08 NOTE — Clinical Social Work Note (Addendum)
Patient received bed offer from [facility]. Patient's son, speaking with Cleon Dew, via phone regarding admission and signing paperwork. Mr. Jarrett indicates that he will not be home until late and cannot complete paperwork until 06/09/18. Patient's granddaughter is employed at the facility.   [facility] will have a room available private pay on Monday 06/11/18. LCSW spoke with Mr. Pinedo (son) and Helene Kelp (daughter) about bed availability on Monday. LCSW discussed that patient had been psyc cleared and had no reason to remain in the ED. LCSW encouraged patient's children to make a plan for supervision throughout this weekend until patient was placed on Monday.  LCSW discussed that we could not hold patient in the ED for social reasons. Helene Kelp and Mr. Duval verbalized multiple reasons why patient could not stay at their home. Helene Kelp stated that she had just adopted children and that patient was a negative impact on their emotions due to his behavior. Mr. Cai stated that he would not get off work until very late and that he had out of town plans the weekend with his children. Helene Kelp stated that she would contact their other sister to see if she would be willing to supervise patient this weekend.  She stated that she would call LCSW back.

## 2018-06-08 NOTE — Clinical Social Work Note (Signed)
Patient's daughter, Helene Kelp, indicates that over the past two weeks patient has started to wander at night.     Kavi Almquist, Clydene Pugh, LCSW

## 2018-06-08 NOTE — BH Assessment (Signed)
[place] Assessment Progress Note    Patient was seen for reassessment.  Patient presents as alert and oriented to person and place.  Patient remembers making statements about wanting to be with Jesus, but denies any intent to harm himself or others. Patient states that he wants to go home and be with his wife and states that he loves her very much and that he would never hurt her.  Patient states that he slept well last night and his appetite is good.  TTS spoke with Earleen Newport, NP who agrees to assess patient for final disposition.

## 2018-06-08 NOTE — ED Notes (Signed)
[facility] rep at bedside.

## 2018-06-08 NOTE — ED Provider Notes (Addendum)
Alert pleasant cooperative denies complaint   Orlie Dakin, MD 06/08/18 0718  10:25 AM spoke with patient's son Iyan Flett, Brooke Bonito. who reports that patient refuses to take his medications.  At home he is verbally abusive to family and he is concerned for family members safety.  He feels that patient requires placement.  Social work consulted    Orlie Dakin, MD 06/08/18 [phone]

## 2018-06-08 NOTE — Consult Note (Signed)
  Tele psych Assessment   Joshua Khan, 72 y.o., male patient seen via tele psych by TTS and this provider; chart reviewed and consulted with Dr. Dwyane Dee on 06/08/18.  Patient presented to [facility] vis EMS under IVC with complaints that patient has dementia and is unable to care for himself anymore; and that patient was talking to angels.  On evaluation Joshua Khan reports that he is eating and sleeping without difficulty.  Patient denies suicidal/self-harm/homicidal ideation, psychosis, and paranoia."  "I would never hurt myself."  Patient asked about talking to Jesus and angels; he responded that he did not talk to them and "They hadn't come around for me yet."  Patient states that he lives at home with his wife.  Patient was aware that he was in the hospital but not sure why.    During evaluation Joshua Khan is alert/oriented to name; DOB and place.  He stated that he was 72 yrs old "or something like that.  Patient did not know the president of [place].  ; calm/cooperative; and mood is congruent with affect.  He does not appear to be responding to internal/external stimuli or delusional thoughts; and denies suicidal/self-harm/homicidal ideation, psychosis, and paranoia.  Patient answered question appropriately.  There is no noted issues of behavioral outburst while patient has been in ED.    For detailed note see TTS tele assessment note  Recommendations:  Patient psychiatrically cleared.  If family is interested in placing patient in skilled nursing memory care social work can assist with resources and information.    Disposition:  Patient is psychiatrically cleared No evidence of imminent risk to self or others at present.   Patient does not meet criteria for psychiatric inpatient admission. Supportive therapy provided about ongoing stressors. Discussed crisis plan, support from social network, calling 911, coming to the Emergency Department, and calling Suicide Hotline.   Spoke with Daphyne, RN  and Dr. Winfred Leeds; both informed of above recommendation and disposition  Earleen Newport, NP

## 2018-06-08 NOTE — NC FL2 (Addendum)
[place] LEVEL OF CARE SCREENING TOOL     IDENTIFICATION  Patient Name: Joshua Khan Birthdate: [birth year] Sex: male Admission Date (Current Location): 06/05/2018  [facility] and [place] Number:  [employer] and Address:  [facility] [address] [place]      Provider Number: [id number]  Attending Physician Name and Address:  Default, Provider, MD  Relative Name and Phone Number:       Current Level of Care: Other (Comment)(Emergency Department) Recommended Level of Care: [place] Prior Approval Number:    Date Approved/Denied:   PASRR Number: [id number])  Discharge Plan: SNF(Patient will need a secure dementia unit)    Current Diagnoses: Patient Active Problem List   Diagnosis Date Noted  . Noncompliance with medications 08/31/2017  . Inappropriate sexual behavior 08/31/2017  . Seizures ([place]) 01/25/2013  . SVT (supraventricular tachycardia) ([place])   . Dementia with behavioral disturbance ([place])   . CAD (coronary artery disease)   . Pre-syncope   . Sinus bradycardia   . Anxiety   . Aortic root dilatation ([place])   . 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 Type 2 diabetes mellitus without complication, without long-term current use of insulin ([place]) 05/19/2009  . HLD (hyperlipidemia) 05/19/2009    Orientation RESPIRATION BLADDER Height & Weight     Self, Place  Normal Continent Weight: 224 lb 13.9 oz (102 kg) Height:  6\' 2"  (188 cm)  BEHAVIORAL SYMPTOMS/MOOD NEUROLOGICAL BOWEL NUTRITION STATUS  Wanderer, Verbally abusive   Continent    AMBULATORY STATUS COMMUNICATION OF NEEDS Skin   Independent Verbally Normal                       Personal Care Assistance Level of Assistance  Bathing, Feeding, Dressing Bathing Assistance: Limited assistance Feeding assistance: Independent Dressing Assistance: Limited assistance     Functional Limitations Info  Sight, Hearing, Speech Sight Info:  Adequate Hearing Info: Adequate Speech Info: Adequate    SPECIAL CARE FACTORS FREQUENCY                       Contractures Contractures Info: Not present    Additional Factors Info  Code Status, Allergies, Psychotropic Code Status Info: Full Code Allergies Info: Aspirin, Codeine, Sulfa Antibiotics, Tomato Psychotropic Info: Seroquel         Current Medications (06/08/2018):  This is the current hospital active medication list Current Facility-Administered Medications  Medication Dose Route Frequency Provider Last Rate Last Dose  . lisinopril (PRINIVIL,ZESTRIL) tablet 10 mg  10 mg Oral Daily Milton Ferguson, MD   10 mg at 06/08/18 0915  . memantine (NAMENDA) tablet 5 mg  5 mg Oral BID Francine Graven, DO   5 mg at 06/08/18 0915  . metFORMIN (GLUCOPHAGE) tablet 1,000 mg  1,000 mg Oral BID Francine Graven, DO   1,000 mg at 06/08/18 0915  . pantoprazole (PROTONIX) EC tablet 80 mg  80 mg Oral Daily Francine Graven, DO   80 mg at 06/08/18 0915  . QUEtiapine (SEROQUEL) tablet 25 mg  25 mg Oral QHS Francine Graven, DO   25 mg at 06/07/18 2111   Current Outpatient Medications  Medication Sig Dispense Refill  . memantine (NAMENDA) 5 MG tablet Take 5 mg by mouth 2 (two) times daily.    . metFORMIN (GLUCOPHAGE) 1000 MG tablet Take 1 tablet (1,000 mg total) by mouth 2 (two) times daily. (Patient not taking: Reported on 06/05/2018) 30 tablet  2  . omeprazole (PRILOSEC) 40 MG capsule Take 40 mg by mouth daily.    . QUEtiapine (SEROQUEL) 25 MG tablet Take 1 tablet (25 mg total) by mouth at bedtime. (Patient not taking: Reported on 06/05/2018) 30 tablet 2     Discharge Medications: Please see discharge summary for a list of discharge medications.  Relevant Imaging Results:  Relevant Lab Results:   Additional Information SSN [id number] Clydene Pugh, LCSW

## 2018-06-08 NOTE — ED Notes (Signed)
Spoke with son Jahseh Lucchese, [place].  Son is concerned about pt not taking his medications.  Son reports Mr. Zayas, SR says inappropriate things to grand kids, his wife and other family members.  Mauri Brooklyn, JR also reports that I has purchased $400 tickets for event with his kids and will not have appropriate care for him.

## 2018-06-08 NOTE — ED Notes (Addendum)
Resting quietly.  Sitter at bedside.

## 2018-06-09 LAB — CBG MONITORING, ED: GLUCOSE-CAPILLARY: 159 mg/dL — AB (ref 70–99)

## 2018-06-09 NOTE — ED Notes (Signed)
Pt woke up momentarily to use restroom and asked where his wife was. RN assured Pt his spouse was at home a sleep and is just fine. Pt said he wanted to go home as he laid back in bed following a trip to the restroom.

## 2018-06-09 NOTE — Discharge Instructions (Addendum)
Follow-up at [facility] in [place] on Monday.

## 2018-06-09 NOTE — ED Notes (Signed)
Pt woke up to use restroom. At this time, RN swapped out ED bed in Pts room for a Hospital bed to provide more comfortable sleep for the Pt. When Pt returned to room, Pt requested information on why he was in the hospital and where his wife and family were at this time. RN redirected Pt and Pt agreed to lay back down and try to get more sleep.

## 2018-06-09 NOTE — ED Provider Notes (Signed)
Son arrives in ED to pick up his dad.  He understands that his father has placement on Monday at the [facility] in [place], [place], [place] 06/09/18 [phone]

## 2018-06-09 NOTE — ED Notes (Signed)
Pt not aggressive at this time, but confused to situation and events that led him to be in hospital. Pt requesting to leave and go home, but RN assured Pt he would be in hospital for 2 more days before he could leave here.

## 2019-03-14 ENCOUNTER — Ambulatory Visit (INDEPENDENT_AMBULATORY_CARE_PROVIDER_SITE_OTHER): Payer: Medicare Other | Admitting: Otolaryngology

## 2019-03-14 DIAGNOSIS — H903 Sensorineural hearing loss, bilateral: Secondary | ICD-10-CM

## 2019-03-14 DIAGNOSIS — H6123 Impacted cerumen, bilateral: Secondary | ICD-10-CM

## 2019-04-04 ENCOUNTER — Ambulatory Visit (INDEPENDENT_AMBULATORY_CARE_PROVIDER_SITE_OTHER): Payer: Medicare Other | Admitting: Otolaryngology

## 2020-02-28 IMAGING — DX DG CHEST 2V
2 series · 2 of 2 positions shown · non-contrast
Comparison: Portable chest x-ray October 05, 2017

CLINICAL DATA: Medical clearance. History of coronary artery
disease, diabetes, former smoker. History of dementia.

EXAM:
CHEST - 2 VIEW

[chest pa]
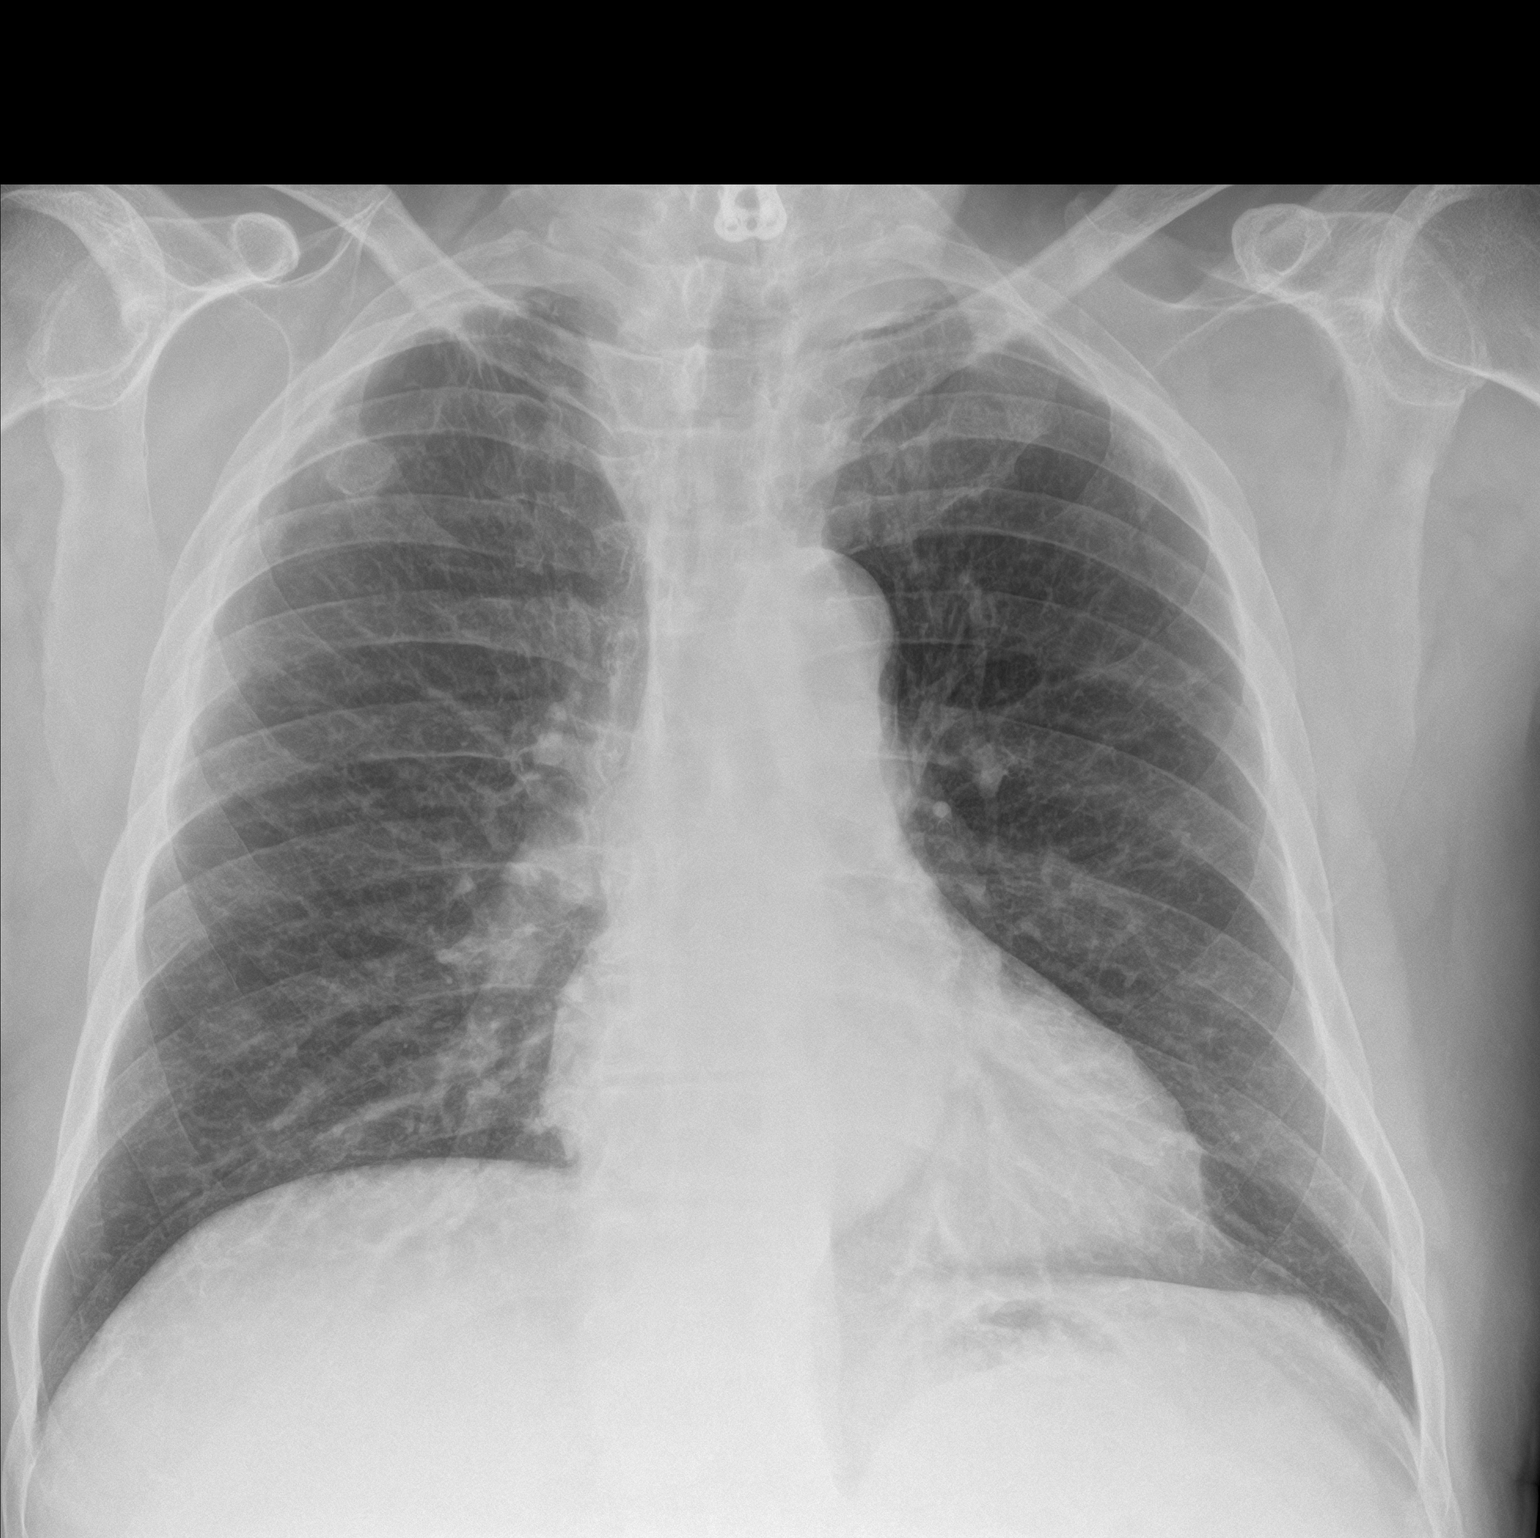

[chest lat]
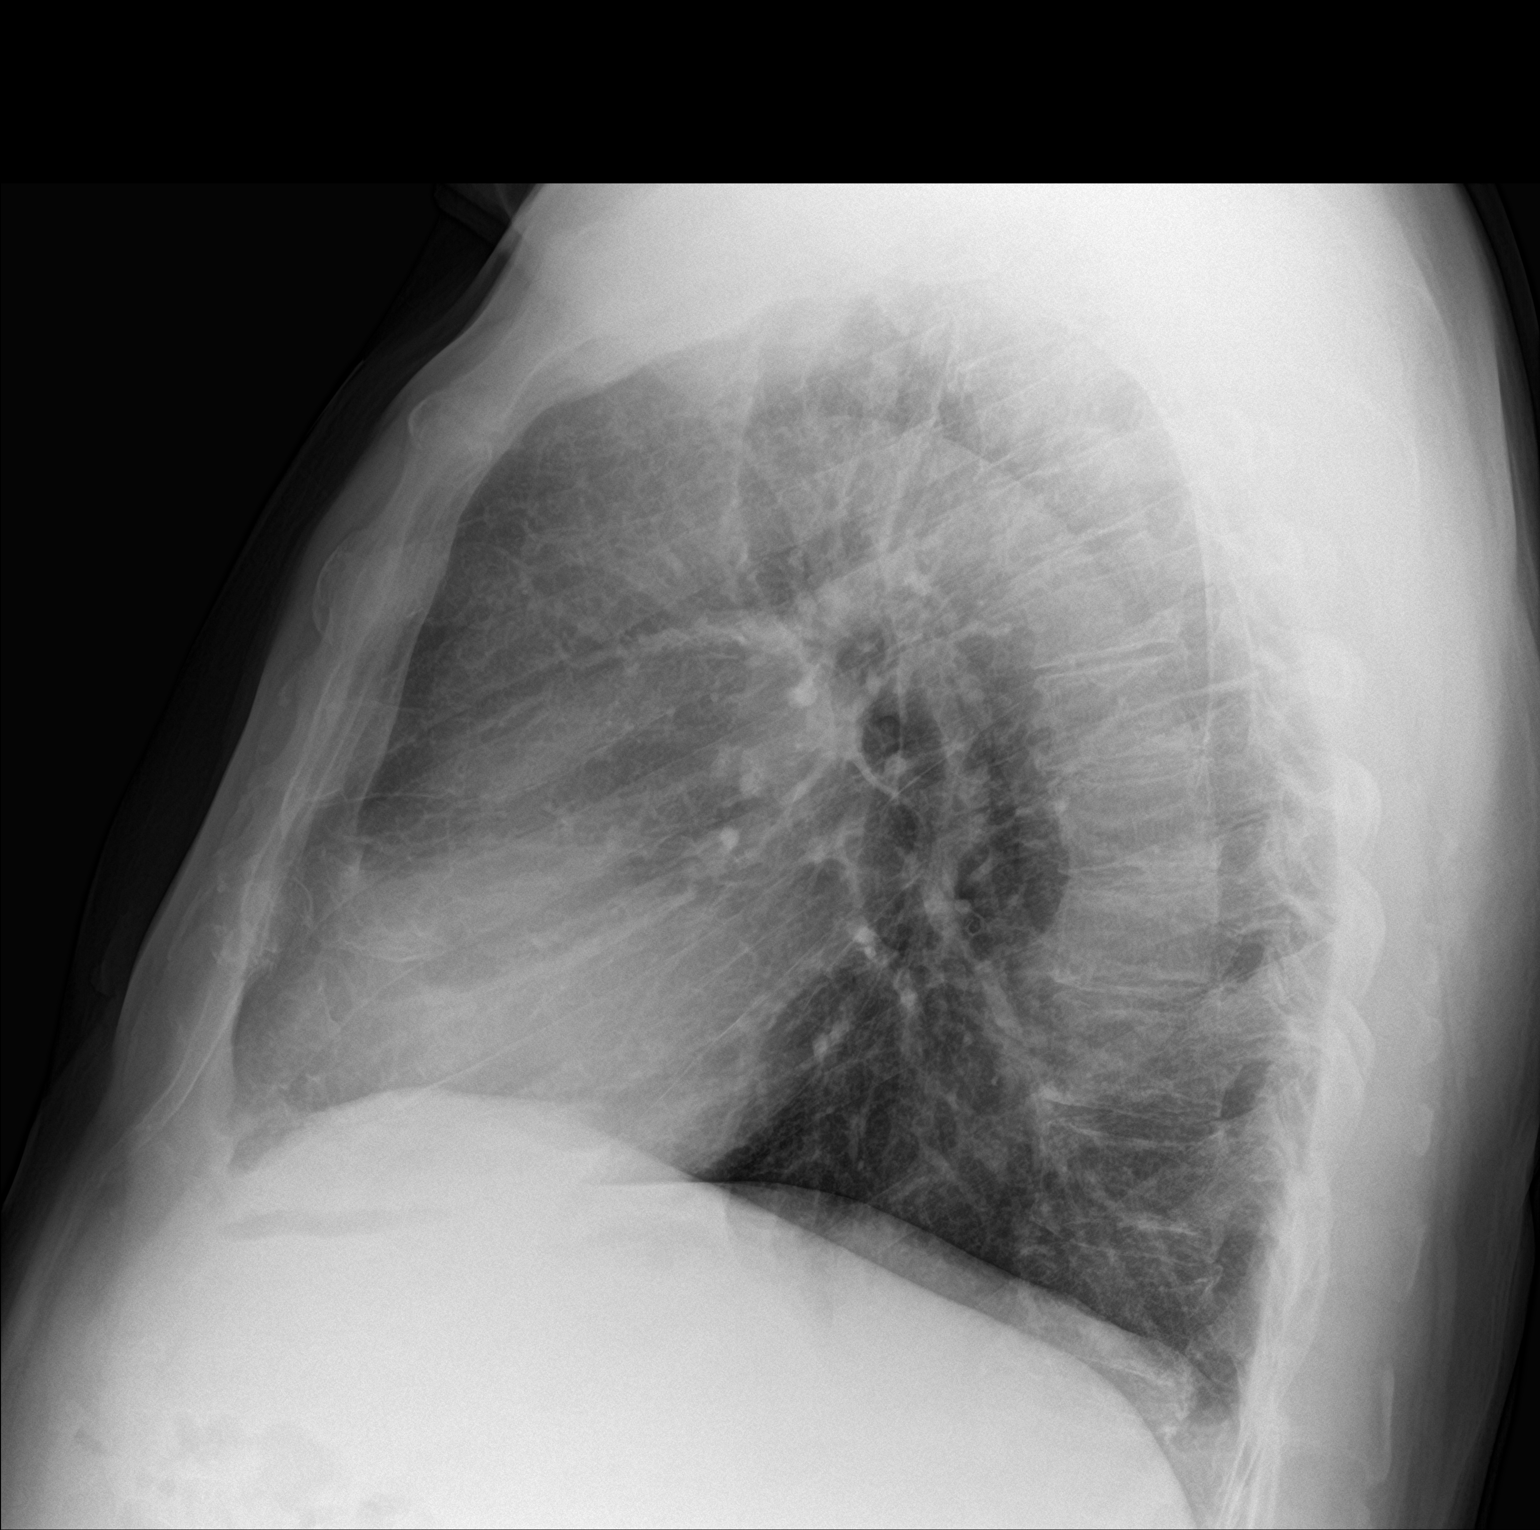

[2 of 2 positions shown; findings below may reference images not displayed]

FINDINGS: The lungs are adequately inflated. The interstitial markings are
coarse but stable. The heart and pulmonary vascularity are normal.
The mediastinum is normal in width. There is tortuosity of the
descending thoracic aorta. There is no pleural effusion. The bony
thorax exhibits no acute abnormality.
IMPRESSION: Mild chronic bronchitic-smoking related changes. No pneumonia, CHF,
nor other acute cardiopulmonary abnormality.

## 2020-07-25 IMAGING — DX DG CHEST 2V
3 series · 3 of 3 positions shown · non-contrast
Comparison: 01/08/2018

CLINICAL DATA: Hallucination

EXAM:
CHEST - 2 VIEW

[chest pa]
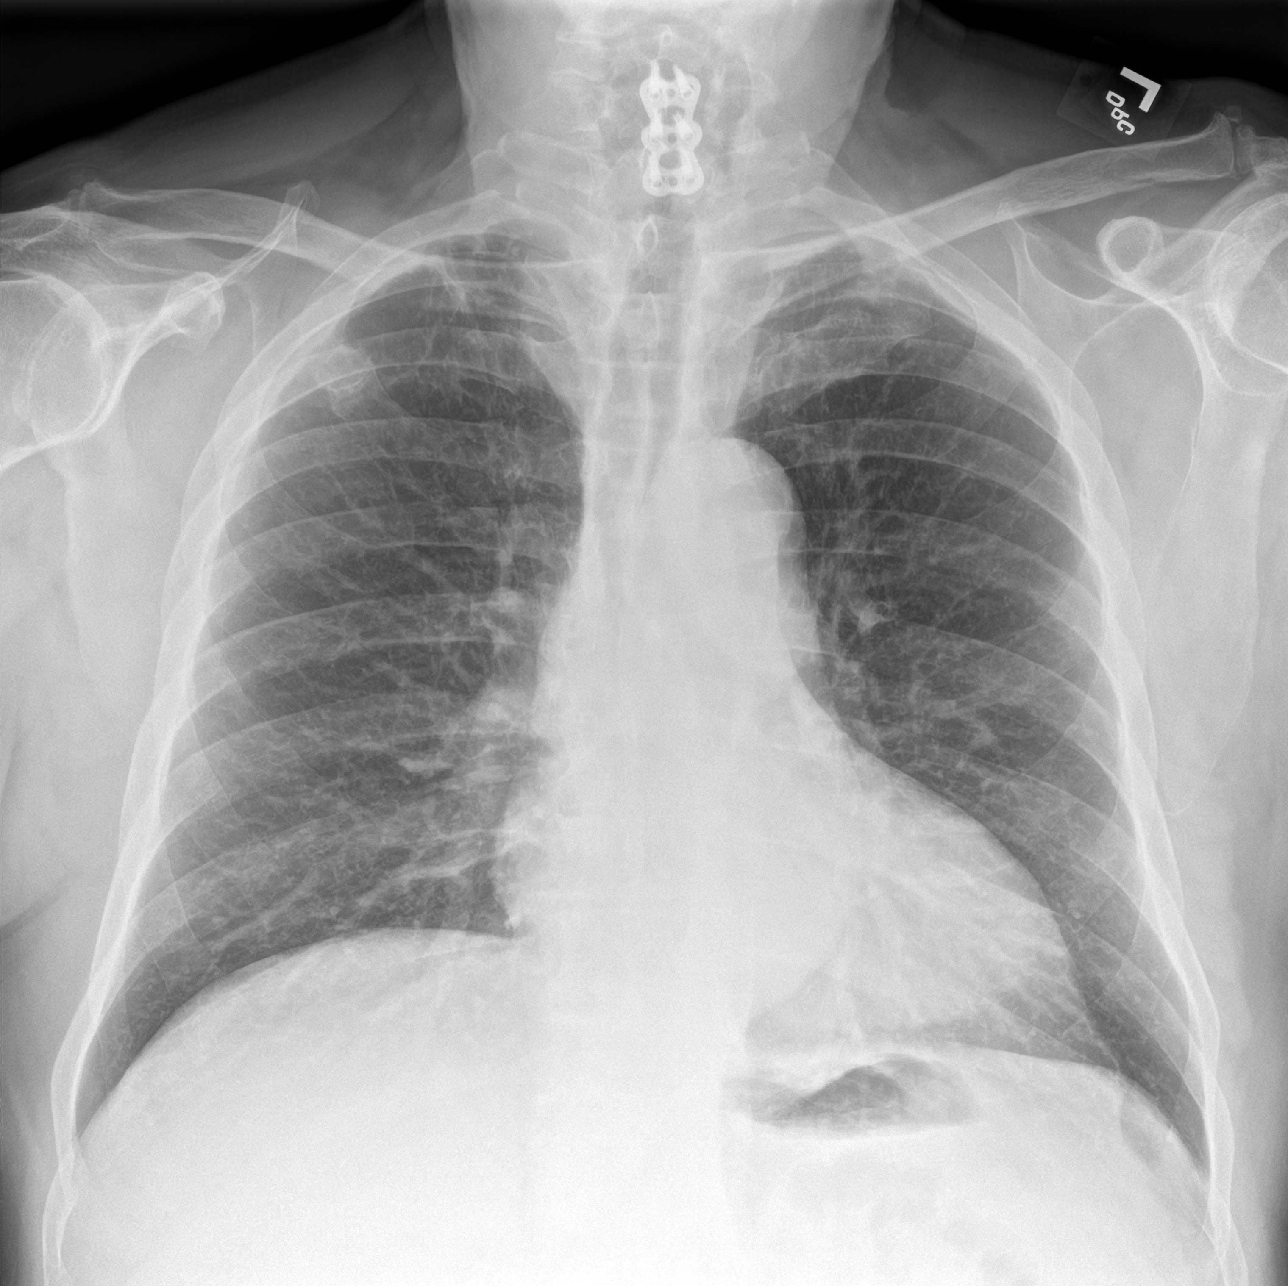

[chest lat (1 of 2)]
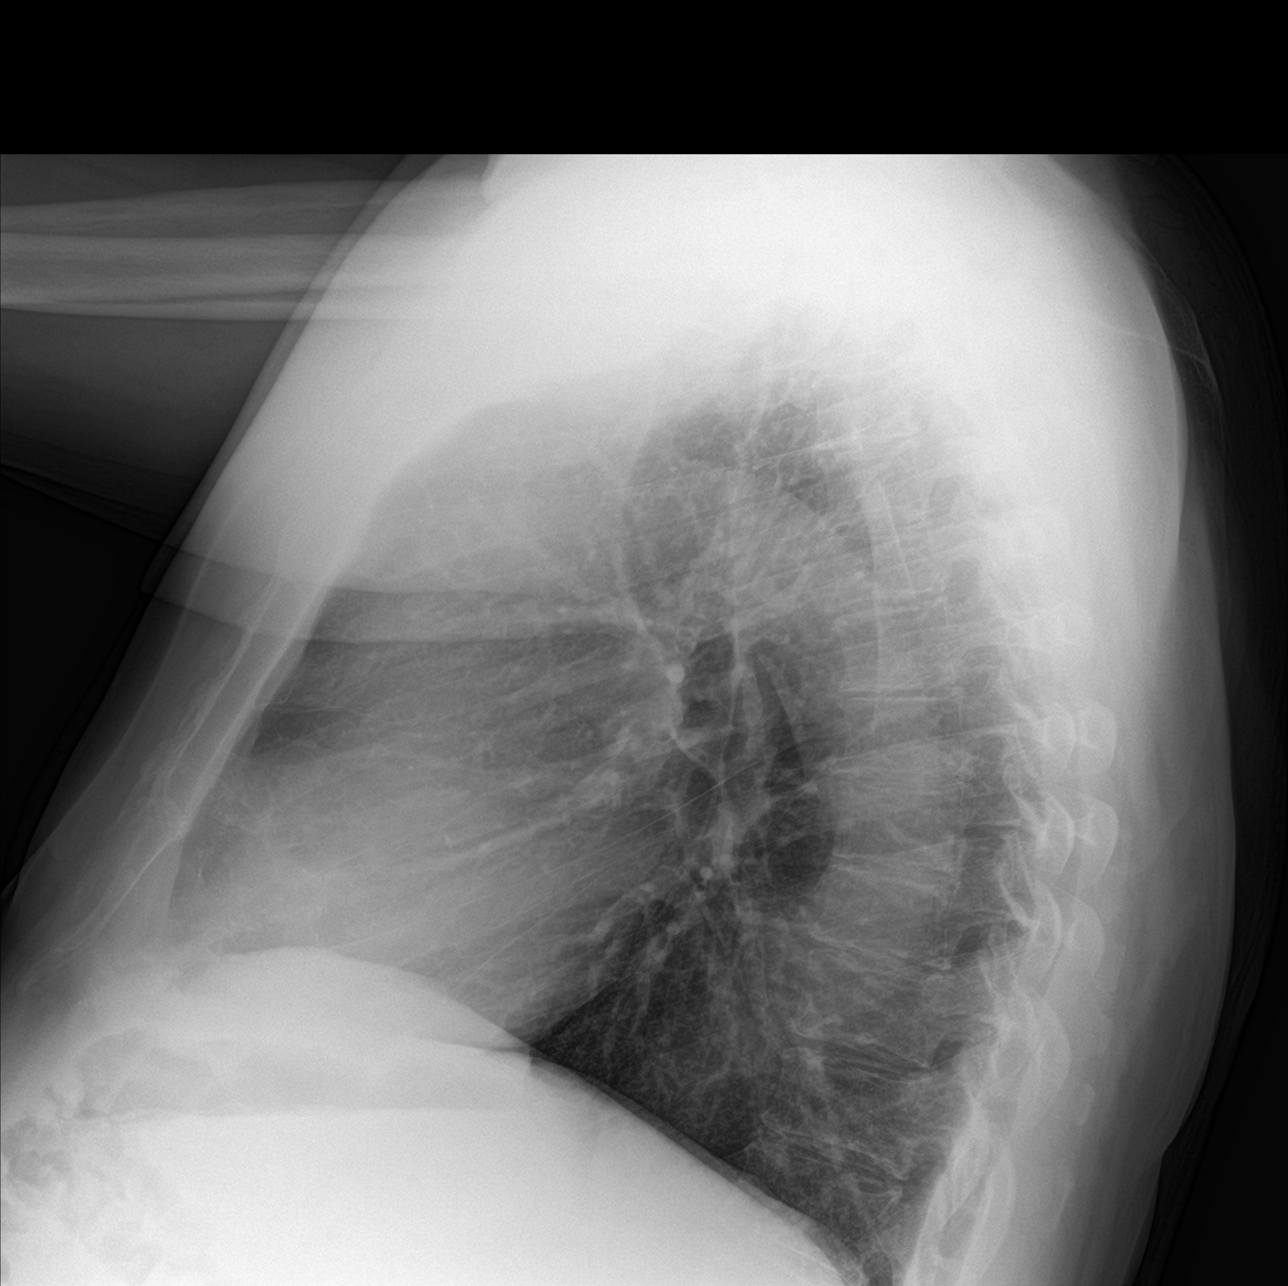

[chest lat (2 of 2)]
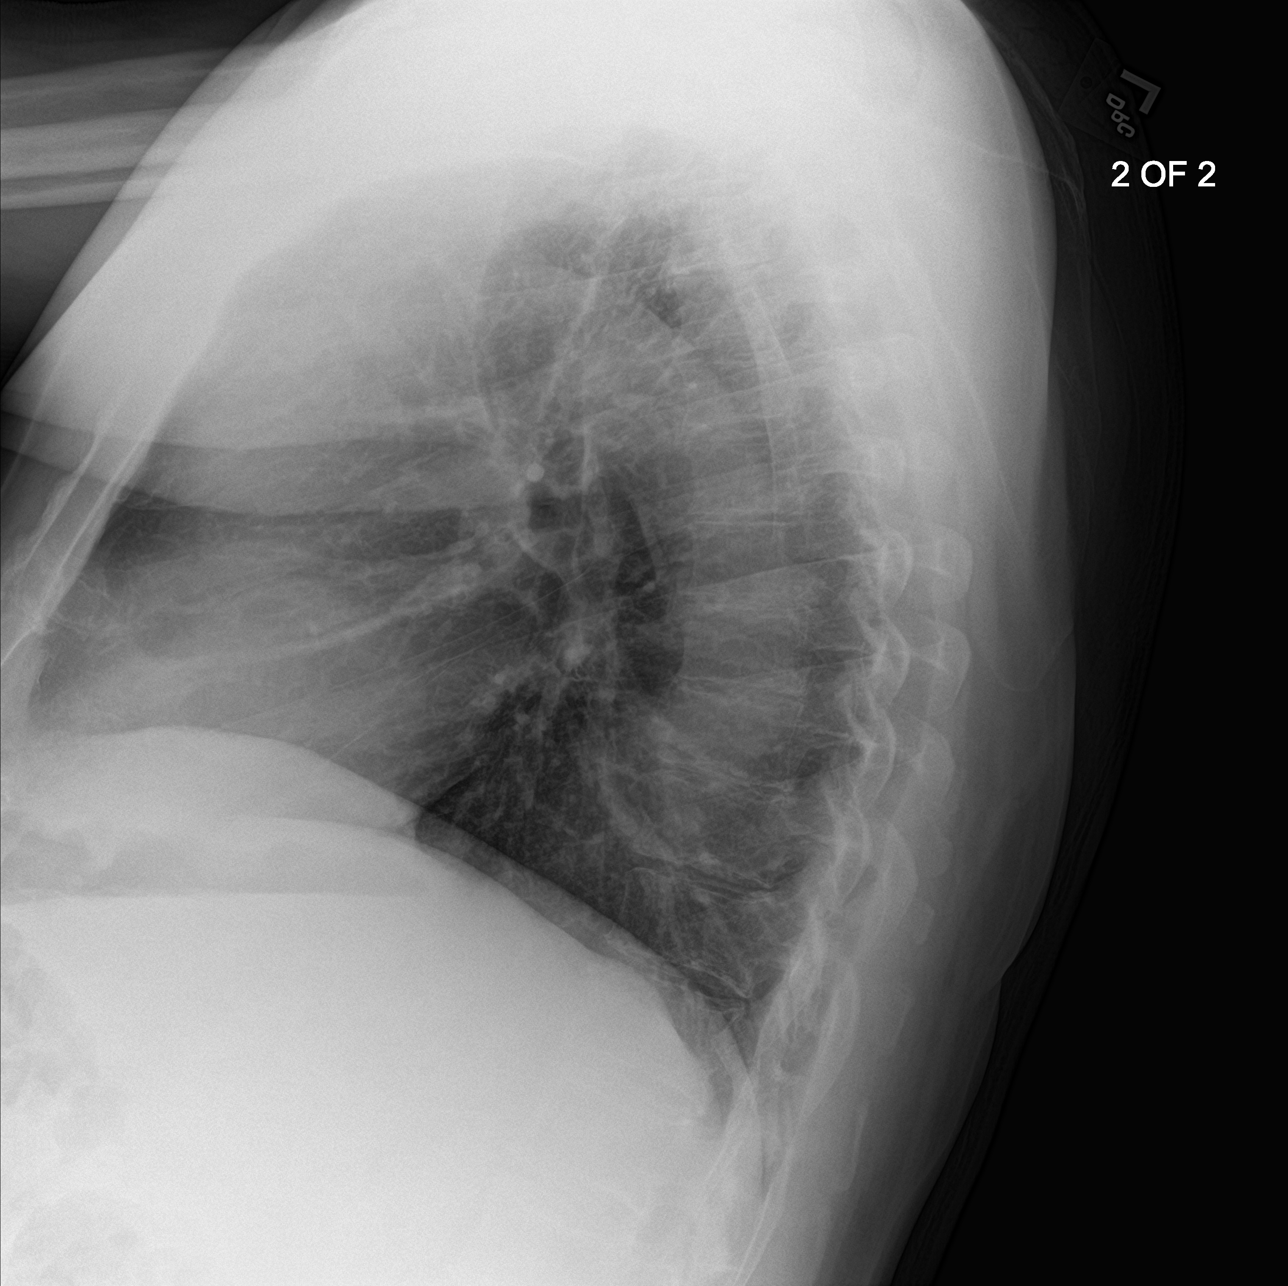

[3 of 3 positions shown; findings below may reference images not displayed]

FINDINGS: The heart size and mediastinal contours are within normal limits.
Both lungs are clear. The visualized skeletal structures are
unremarkable.
IMPRESSION: No active cardiopulmonary disease.

## 2022-05-09 ENCOUNTER — Other Ambulatory Visit: Payer: Self-pay | Admitting: Neurology

## 2022-05-09 ENCOUNTER — Other Ambulatory Visit (HOSPITAL_COMMUNITY): Payer: Self-pay | Admitting: Neurology

## 2022-05-09 DIAGNOSIS — R2689 Other abnormalities of gait and mobility: Secondary | ICD-10-CM

## 2022-05-09 DIAGNOSIS — F0280 Dementia in other diseases classified elsewhere without behavioral disturbance: Secondary | ICD-10-CM

## 2022-05-20 ENCOUNTER — Ambulatory Visit (HOSPITAL_COMMUNITY): Payer: Medicare Other

## 2022-05-20 ENCOUNTER — Encounter (HOSPITAL_COMMUNITY): Payer: Self-pay

## 2022-09-29 DEATH — deceased
